# Patient Record
Sex: Female | Born: 1975 | Race: Black or African American | Hispanic: No | Marital: Married | Smoking: Never smoker
Health system: Southern US, Community
[De-identification: ages and names within clinical notes are randomized; demographics above are authoritative.]

## PROBLEM LIST (undated history)

## (undated) DIAGNOSIS — R519 Headache, unspecified: Secondary | ICD-10-CM

## (undated) DIAGNOSIS — R87619 Unspecified abnormal cytological findings in specimens from cervix uteri: Secondary | ICD-10-CM

## (undated) DIAGNOSIS — G43909 Migraine, unspecified, not intractable, without status migrainosus: Secondary | ICD-10-CM

## (undated) DIAGNOSIS — R51 Headache: Secondary | ICD-10-CM

## (undated) DIAGNOSIS — I10 Essential (primary) hypertension: Secondary | ICD-10-CM

## (undated) DIAGNOSIS — E119 Type 2 diabetes mellitus without complications: Secondary | ICD-10-CM

## (undated) HISTORY — PX: LEEP: SHX91

## (undated) HISTORY — DX: Migraine, unspecified, not intractable, without status migrainosus: G43.909

## (undated) HISTORY — DX: Headache: R51

## (undated) HISTORY — DX: Headache, unspecified: R51.9

## (undated) HISTORY — PX: TUBAL LIGATION: SHX77

## (undated) HISTORY — DX: Type 2 diabetes mellitus without complications: E11.9

## (undated) HISTORY — DX: Unspecified abnormal cytological findings in specimens from cervix uteri: R87.619

## (undated) HISTORY — DX: Essential (primary) hypertension: I10

---

## 2007-08-17 ENCOUNTER — Ambulatory Visit: Payer: Self-pay

## 2007-11-17 ENCOUNTER — Emergency Department: Payer: Self-pay | Admitting: Emergency Medicine

## 2009-04-05 IMAGING — US US EXTREM LOW VENOUS*R*
1 series · 18 of 23 positions shown · non-contrast
Comparison: none

REASON FOR EXAM: swelling, R leg, eval for dvt
COMMENTS:

PROCEDURE:     US  - US DOPPLER LOW EXTR RIGHT  - November 17, 2007  [DATE]
RESULT:     Standard RIGHT lower extremity color-flow Duplex Doppler reveals
no evidence of deep venous thrombosis.  Color-flow analysis and Doppler
analysis are negative.

[Series 1: us extrem low venous*right* · 18 of 23 slices shown]
[im 1/23]
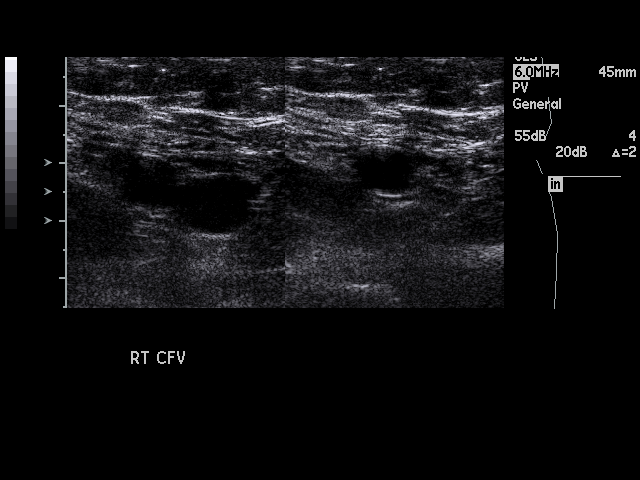
[im 2/23]
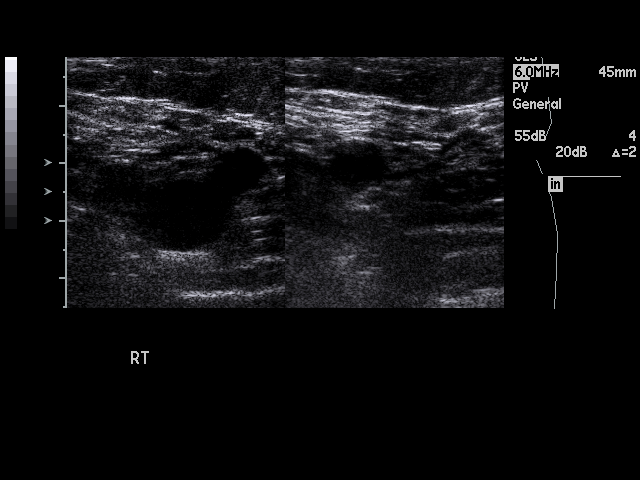
[im 4/23]
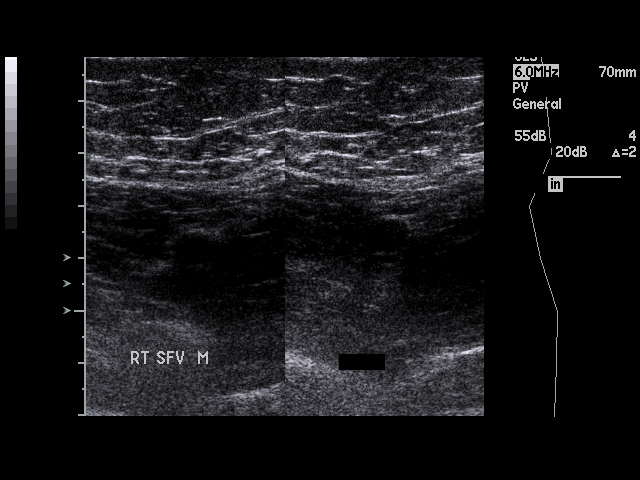
[im 5/23]
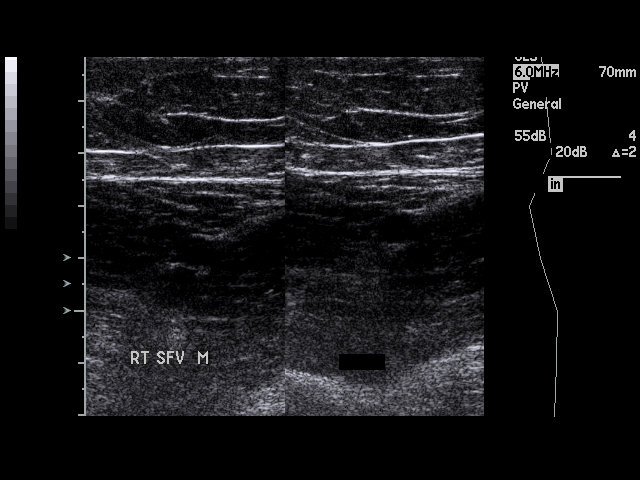
[im 6/23]
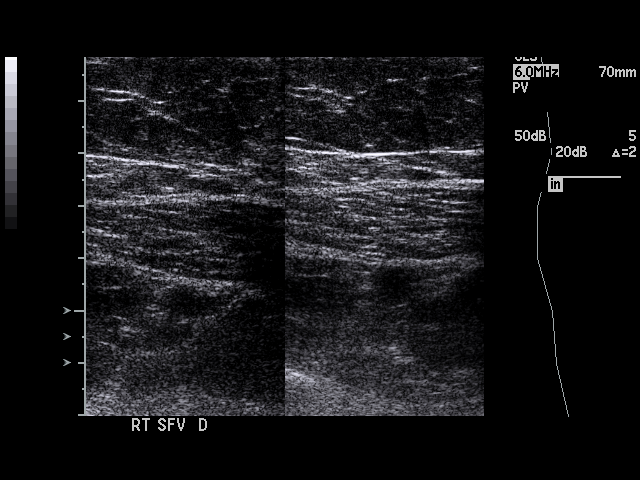
[im 8/23]
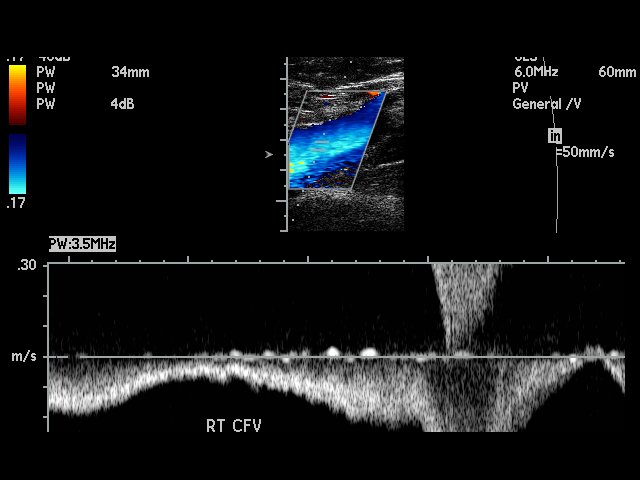
[im 9/23]
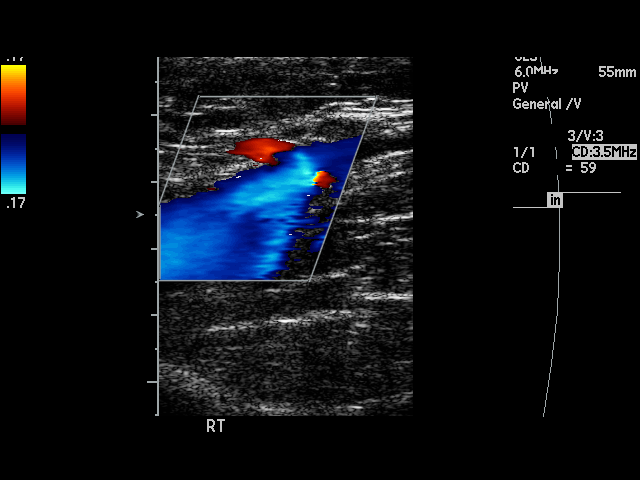
[im 10/23]
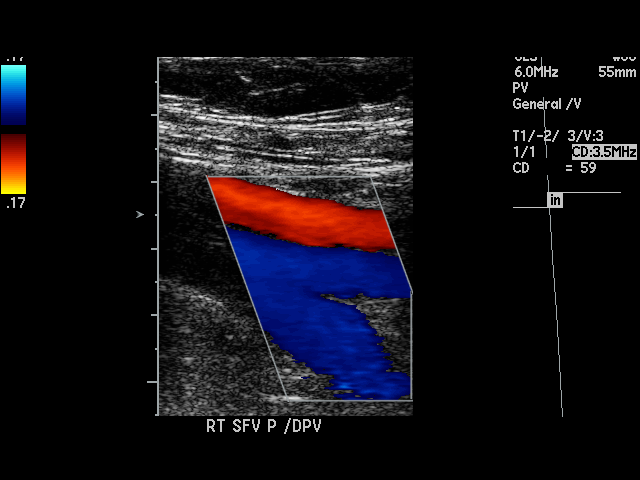
[im 11/23]
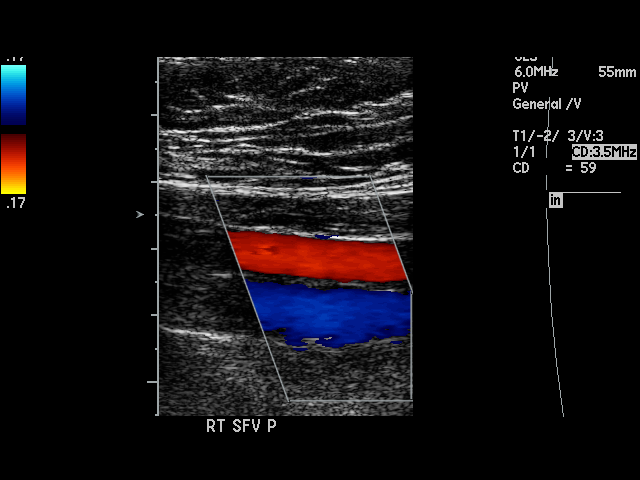
[im 13/23]
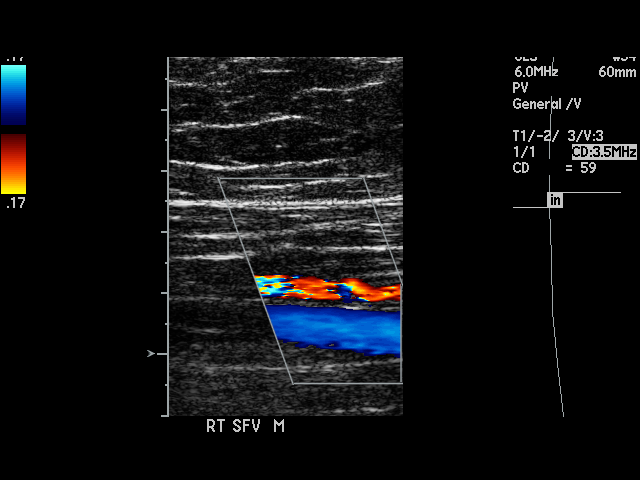
[im 14/23]
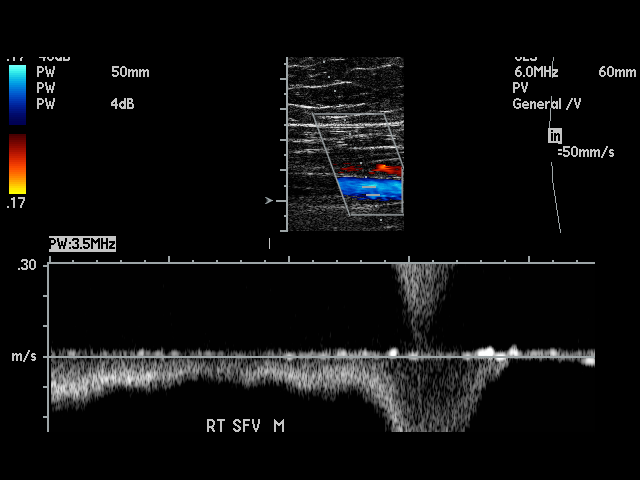
[im 15/23]
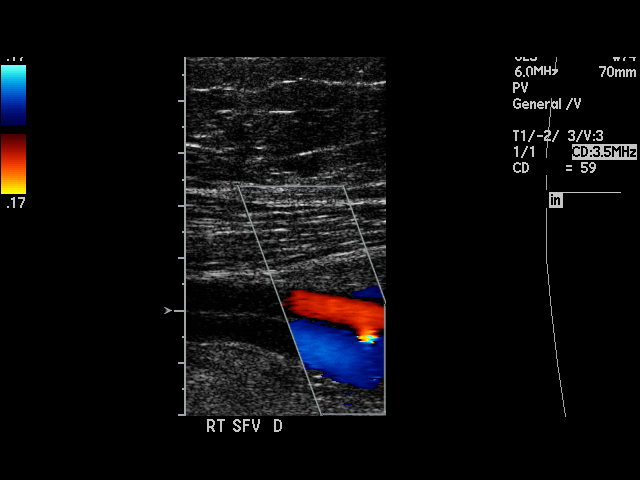
[im 16/23]
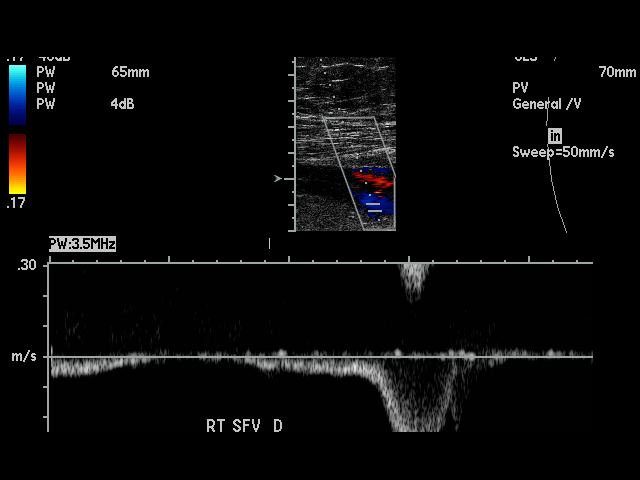
[im 18/23]
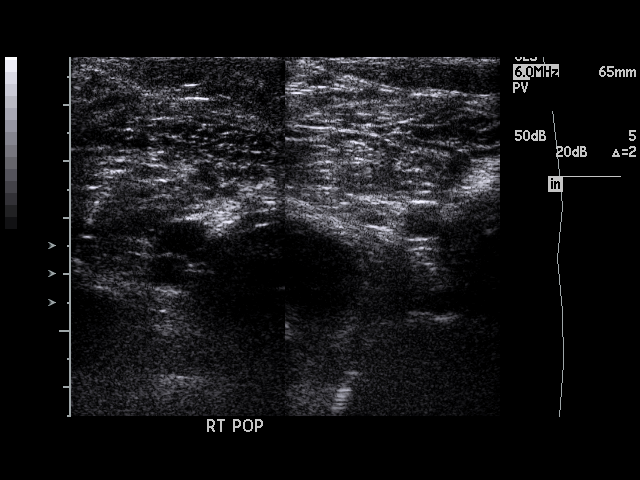
[im 19/23]
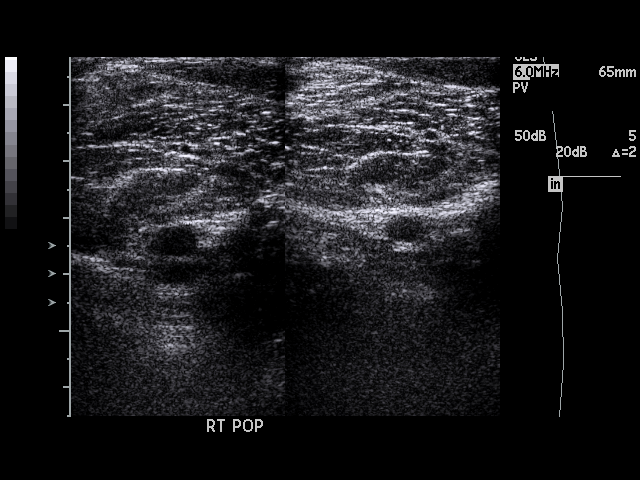
[im 20/23]
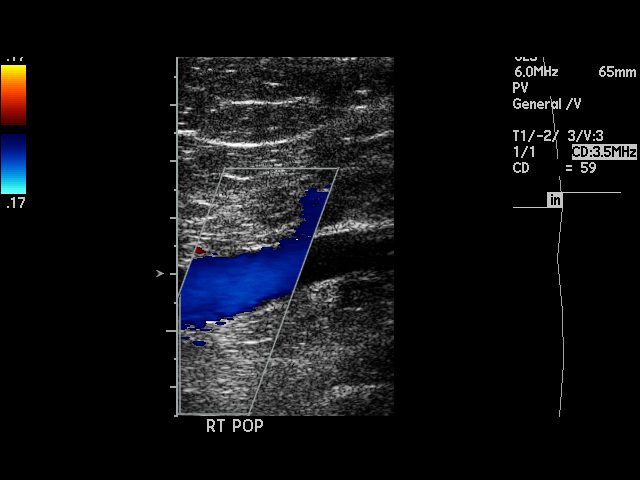
[im 22/23]
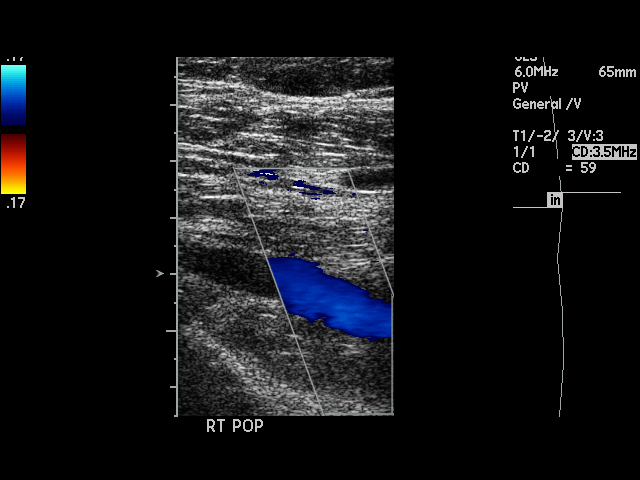
[im 23/23]
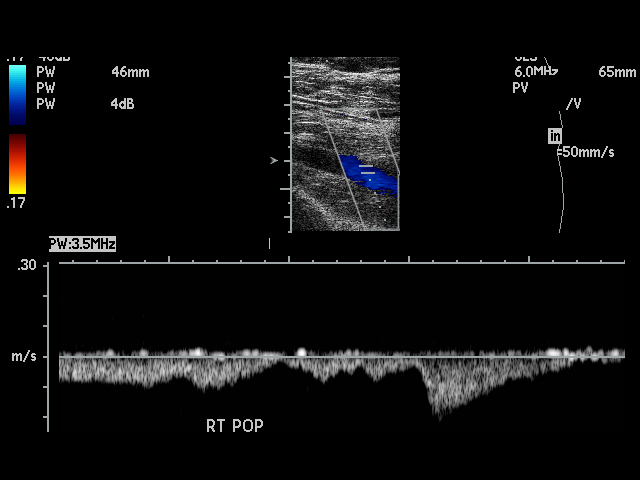

[18 of 23 positions shown; findings below may reference images not displayed]

IMPRESSION: 1)Negative exam.

## 2009-04-05 IMAGING — CR DG FEMUR 2V*R*
1 series · 4 of 4 positions shown · non-contrast
Comparison: none

REASON FOR EXAM: swelling, pain, R thigh
COMMENTS:

PROCEDURE:     DXR - DXR FEMUR RIGHT  - November 17, 2007  [DATE]
RESULT:     No fracture, dislocation or other acute bony abnormality is
identified. No periosteal reactive change is seen. No lytic or blastic
lesions are noted. No radiodense soft tissue foreign body is observed.

[Series 1: view not recorded · 0.17mm/px · 4 of 4 slices shown]
[im 1/4]
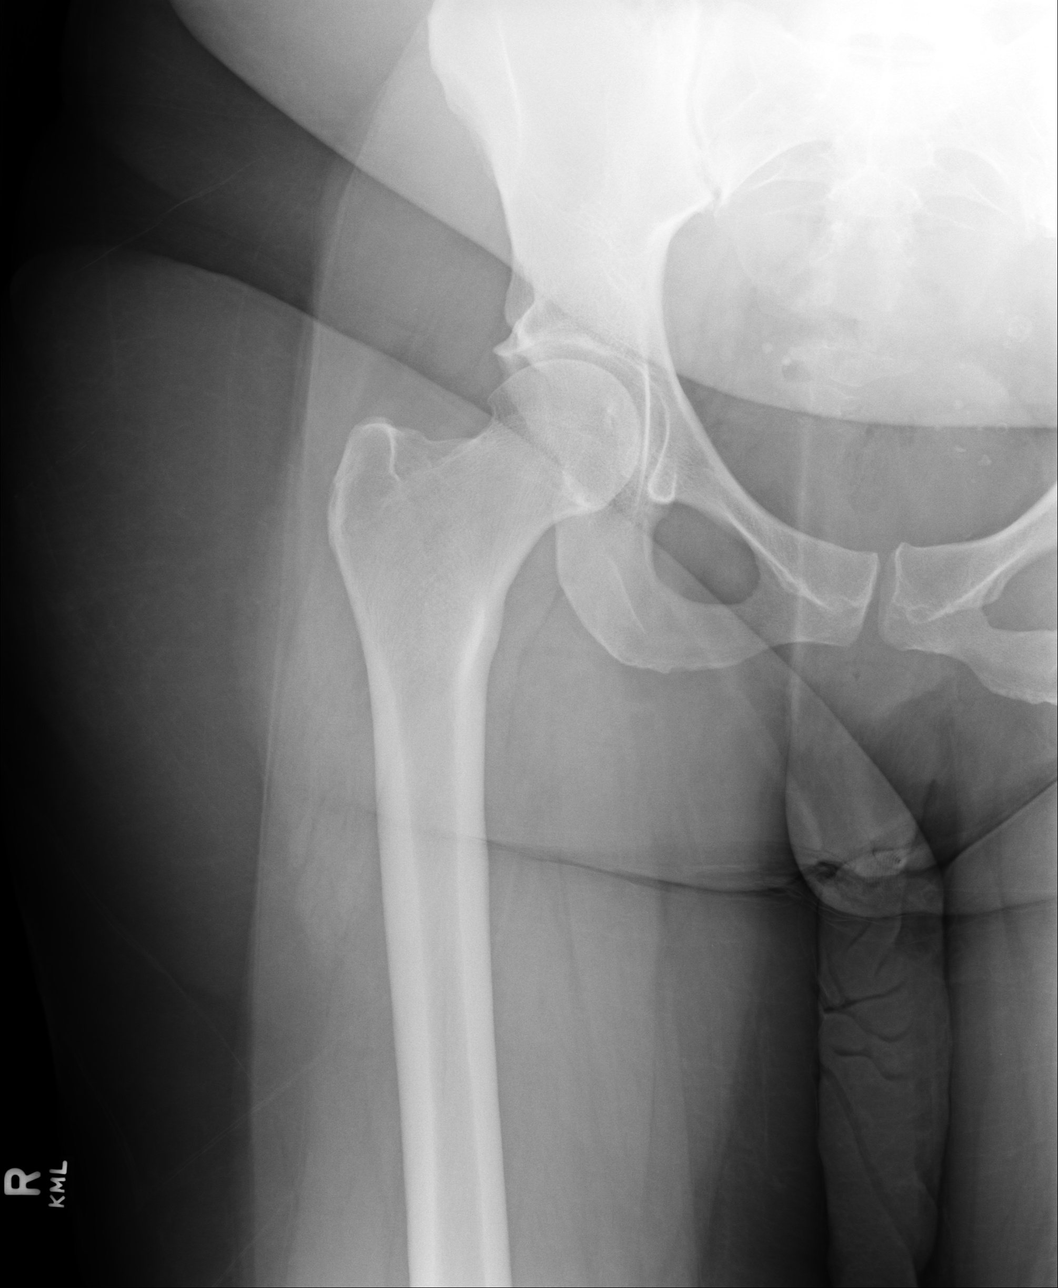
[im 2/4]
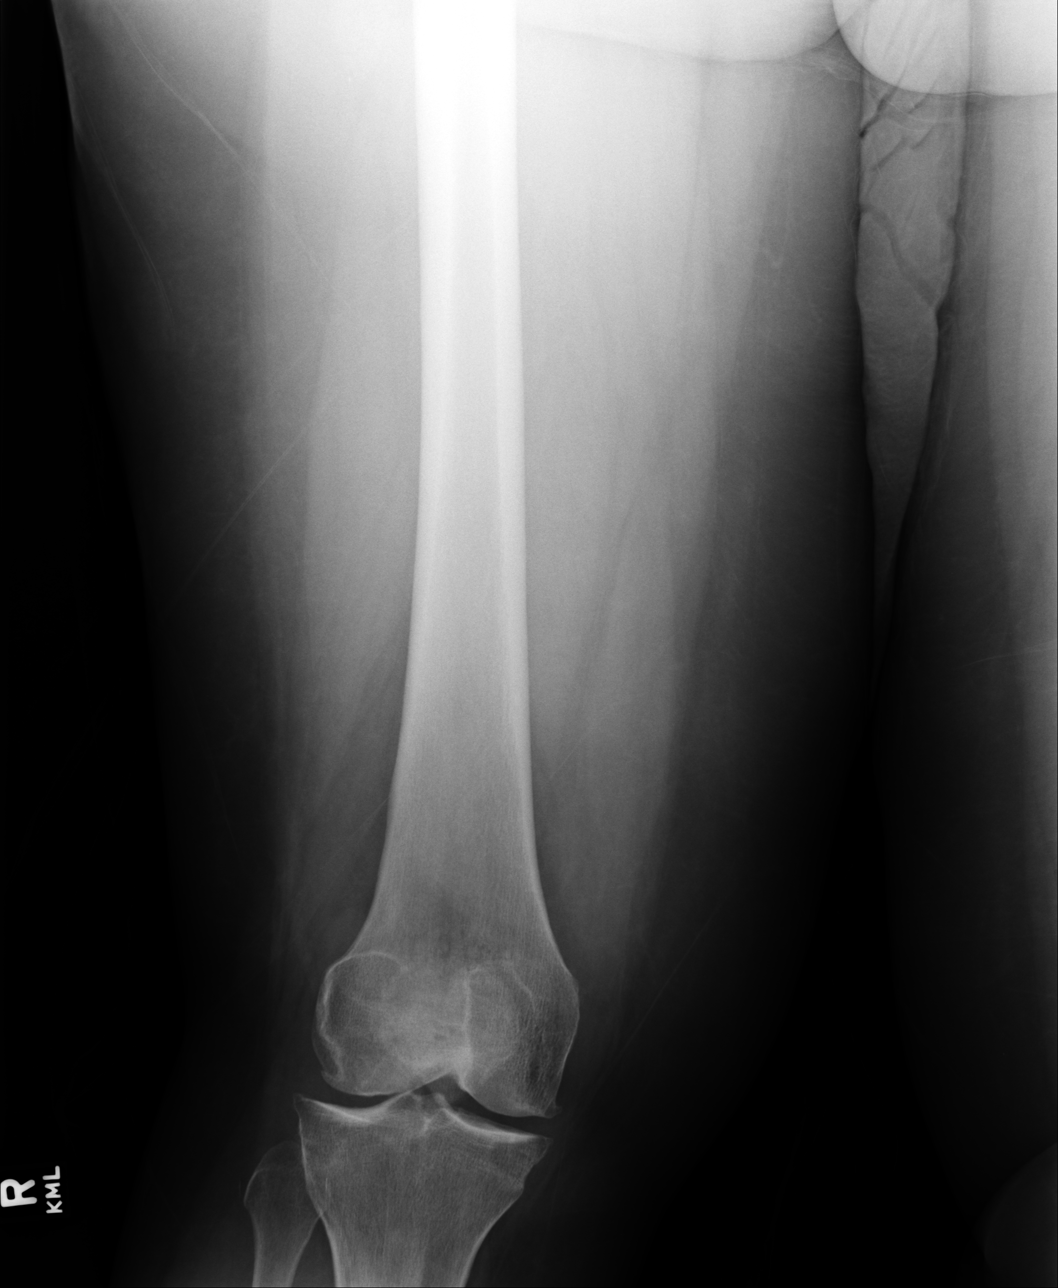
[im 3/4]
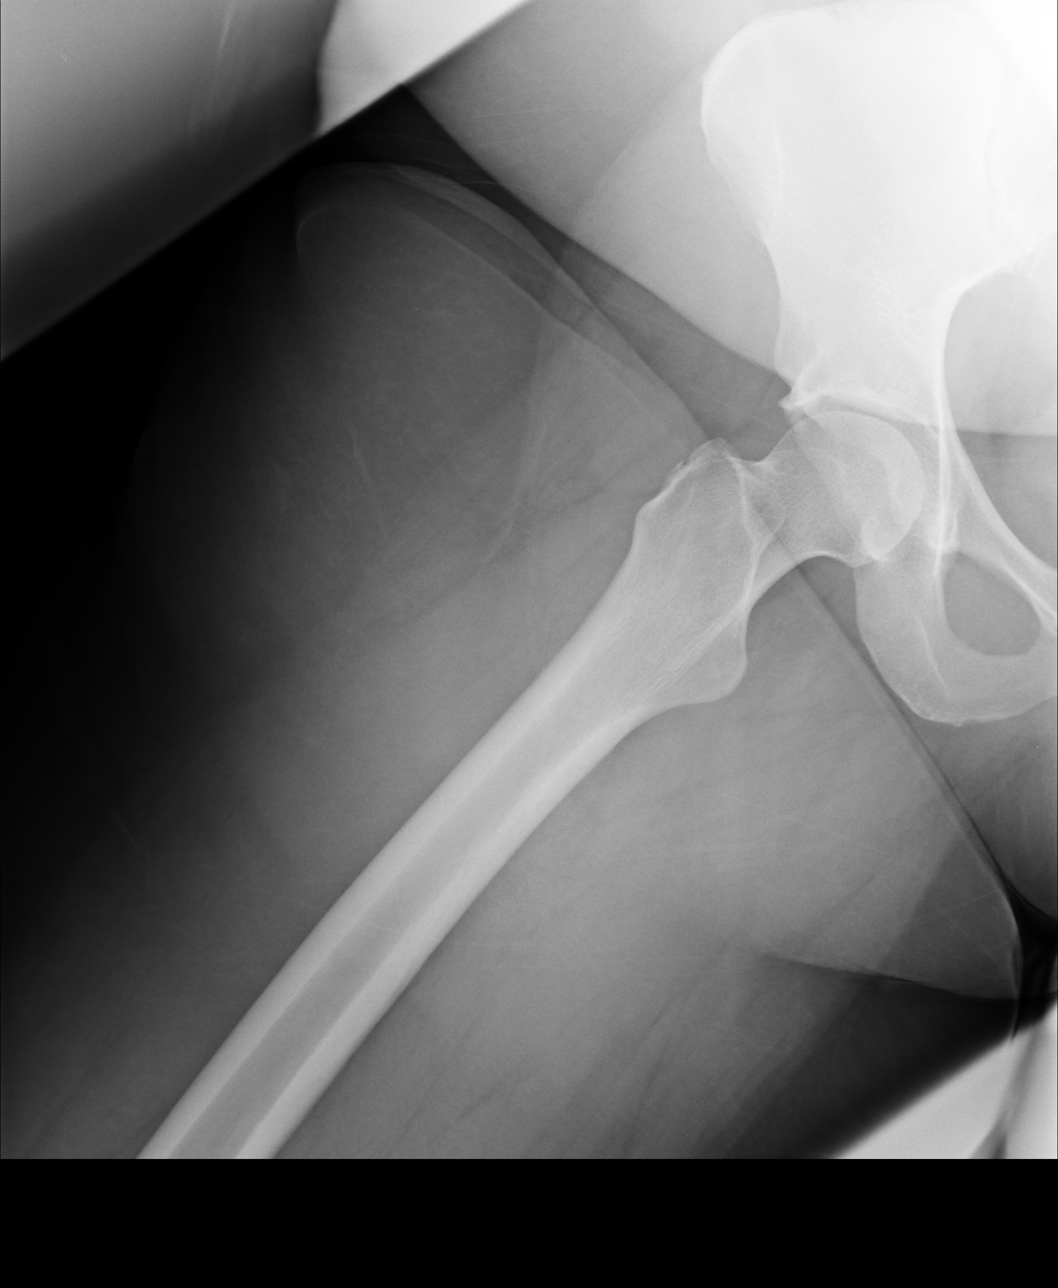
[im 4/4]
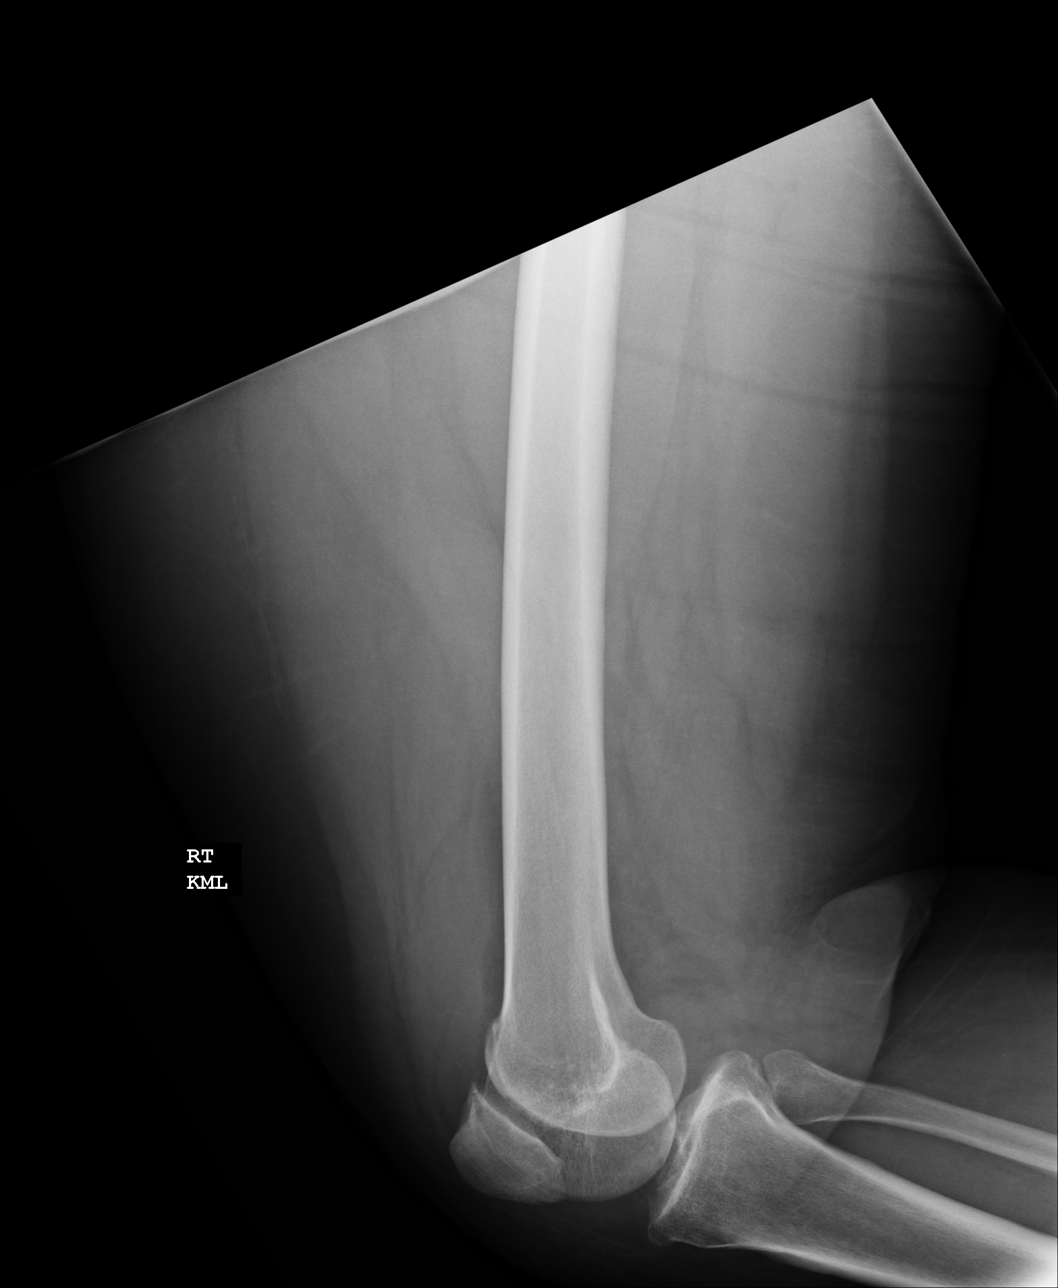

[4 of 4 positions shown; findings below may reference images not displayed]

IMPRESSION: 1.     No significant abnormalities are identified.

## 2009-09-18 ENCOUNTER — Ambulatory Visit: Payer: Self-pay

## 2011-01-21 ENCOUNTER — Ambulatory Visit: Payer: Self-pay | Admitting: Family Medicine

## 2011-02-22 ENCOUNTER — Ambulatory Visit: Payer: Self-pay | Admitting: Family Medicine

## 2013-10-04 DIAGNOSIS — R87619 Unspecified abnormal cytological findings in specimens from cervix uteri: Secondary | ICD-10-CM

## 2013-10-04 HISTORY — DX: Unspecified abnormal cytological findings in specimens from cervix uteri: R87.619

## 2015-10-25 ENCOUNTER — Ambulatory Visit (INDEPENDENT_AMBULATORY_CARE_PROVIDER_SITE_OTHER): Payer: 59 | Admitting: Family Medicine

## 2015-10-25 ENCOUNTER — Encounter: Payer: Self-pay | Admitting: Family Medicine

## 2015-10-25 VITALS — BP 132/90 | HR 85 | Temp 98.5°F | Ht 67.0 in | Wt 264.1 lb

## 2015-10-25 DIAGNOSIS — G44229 Chronic tension-type headache, not intractable: Secondary | ICD-10-CM

## 2015-10-25 DIAGNOSIS — Z8742 Personal history of other diseases of the female genital tract: Secondary | ICD-10-CM | POA: Diagnosis not present

## 2015-10-25 DIAGNOSIS — R519 Headache, unspecified: Secondary | ICD-10-CM | POA: Insufficient documentation

## 2015-10-25 DIAGNOSIS — R51 Headache: Secondary | ICD-10-CM

## 2015-10-25 DIAGNOSIS — Z23 Encounter for immunization: Secondary | ICD-10-CM | POA: Diagnosis not present

## 2015-10-25 DIAGNOSIS — I10 Essential (primary) hypertension: Secondary | ICD-10-CM | POA: Diagnosis not present

## 2015-10-25 LAB — CBC
HCT: 36 % (ref 36.0–46.0)
HEMOGLOBIN: 11.8 g/dL — AB (ref 12.0–15.0)
MCHC: 32.7 g/dL (ref 30.0–36.0)
MCV: 87.3 fl (ref 78.0–100.0)
Platelets: 251 10*3/uL (ref 150.0–400.0)
RBC: 4.12 Mil/uL (ref 3.87–5.11)
RDW: 15 % (ref 11.5–15.5)
WBC: 4.4 10*3/uL (ref 4.0–10.5)

## 2015-10-25 LAB — COMPREHENSIVE METABOLIC PANEL
ALBUMIN: 3.6 g/dL (ref 3.5–5.2)
ALT: 15 U/L (ref 0–35)
AST: 14 U/L (ref 0–37)
Alkaline Phosphatase: 56 U/L (ref 39–117)
BUN: 9 mg/dL (ref 6–23)
CALCIUM: 8.8 mg/dL (ref 8.4–10.5)
CHLORIDE: 106 meq/L (ref 96–112)
CO2: 24 mEq/L (ref 19–32)
CREATININE: 0.77 mg/dL (ref 0.40–1.20)
GFR: 106.02 mL/min (ref >60.00)
Glucose, Bld: 104 mg/dL — ABNORMAL HIGH (ref 70–99)
POTASSIUM: 3.7 meq/L (ref 3.5–5.1)
Sodium: 138 mEq/L (ref 135–145)
Total Bilirubin: 0.2 mg/dL (ref 0.2–1.2)
Total Protein: 6.7 g/dL (ref 6.0–8.3)

## 2015-10-25 LAB — HEMOGLOBIN A1C: Hgb A1c MFr Bld: 6.2 % (ref 4.6–6.5)

## 2015-10-25 LAB — LIPID PANEL
Cholesterol: 120 mg/dL (ref 0–200)
HDL: 37.8 mg/dL — AB (ref >39.00)
LDL Cholesterol: 71 mg/dL (ref 0–99)
NONHDL: 82.37
TRIGLYCERIDES: 58 mg/dL (ref 0.0–149.0)
Total CHOL/HDL Ratio: 3
VLDL: 11.6 mg/dL (ref 0.0–40.0)

## 2015-10-25 LAB — TSH: TSH: 2.02 u[IU]/mL (ref 0.35–4.50)

## 2015-10-25 MED ORDER — AMLODIPINE BESYLATE 5 MG PO TABS: 5.0000 mg | ORAL_TABLET | Freq: Every day | ORAL | Status: DC

## 2015-10-25 NOTE — Progress Notes (Signed)
Patient ID: [name], female   DOB: 1976/04/20, 40 y.o.   MRN: [id number]  Tommi Rumps, MD Phone: [phone]  Donna Valencia is a 40 y.o. female who presents today for new patient visit.  Hypertension: Patient notes she was previously on blood pressure medicine and took herself off of them last October. She notes since doing this she has developed some headaches that she has previously had when her blood pressure was uncontrolled. She checks her blood pressure at home and it has recently been in the 160s over 90s. She denies chest pain, shortness of breath, vision changes, numbness, weakness, and edema. She was previously on lisinopril.  Headaches: Patient notes the headaches are frontal in nature. Occurred 2 times per week. Last all day. Do not wake her from sleep. She denies numbness, weakness, and vision changes with these. She notes Tylenol helps with them.  Patient also notes it she had an abnormal Pap smear or most recent Pap smear 2015. She was supposed to follow up with gynecology for this though never did.  Active Ambulatory Problems    Diagnosis Date Noted  . Essential hypertension 10/25/2015  . Headache 10/25/2015  . History of abnormal cervical Pap smear 10/25/2015   Resolved Ambulatory Problems    Diagnosis Date Noted  . No Resolved Ambulatory Problems   Past Medical History  Diagnosis Date  . Hypertension   . Migraines     Family History  Problem Relation Age of Onset  . Arthritis    . Breast cancer      Grandparent, another relative  . Hypertension      Parent  . Diabetes      Parent  . Cancer      Both parents    Social History   Social History  . Marital Status: Married    Spouse Name: N/A  . Number of Children: N/A  . Years of Education: N/A   Occupational History  . Not on file.   Social History Main Topics  . Smoking status: Never Smoker   . Smokeless tobacco: Not on file  . Alcohol Use: No  . Drug Use: No  . Sexual Activity: Not on  file   Other Topics Concern  . Not on file   Social History Narrative  . No narrative on file    ROS   General:  Negative for nexplained weight loss, fever Skin: Negative for new or changing mole, sore that won't heal HEENT: Negative for trouble hearing, trouble seeing, ringing in ears, mouth sores, hoarseness, change in voice, dysphagia. CV:  Negative for chest pain, dyspnea, edema, palpitations Resp: Negative for cough, dyspnea, hemoptysis GI: Negative for nausea, vomiting, diarrhea, constipation, abdominal pain, melena, hematochezia. GU: Negative for dysuria, incontinence, urinary hesitance, hematuria, vaginal or penile discharge, polyuria, sexual difficulty, lumps in testicle or breasts MSK: Negative for muscle cramps or aches, joint pain or swelling Neuro: Positive for headaches, Negative for weakness, numbness, dizziness, passing out/fainting Psych: Negative for depression, anxiety, memory problems  Objective  Physical Exam Filed Vitals:   10/25/15 0932  BP: 132/90  Pulse: 85  Temp: 98.5 F (36.9 C)    BP Readings from Last 3 Encounters:  10/25/15 132/90   Wt Readings from Last 3 Encounters:  10/25/15 264 lb 1.9 oz (119.804 kg)    Physical Exam  Constitutional: She is well-developed, well-nourished, and in no distress.  HENT:  Head: Normocephalic and atraumatic.  Right Ear: External ear normal.  Left Ear: External ear  normal.  Mouth/Throat: Oropharynx is clear and moist. No oropharyngeal exudate.  Eyes: Conjunctivae are normal. Pupils are equal, round, and reactive to light.  Neck: Neck supple.  Cardiovascular: Normal rate, regular rhythm and normal heart sounds.  Exam reveals no gallop and no friction rub.   No murmur heard. Pulmonary/Chest: Effort normal and breath sounds normal. No respiratory distress. She has no wheezes. She has no rales.  Abdominal: Soft. Bowel sounds are normal. She exhibits no distension. There is no tenderness. There is no rebound  and no guarding.  Musculoskeletal: She exhibits no edema.  Lymphadenopathy:    She has no cervical adenopathy.  Neurological: She is alert. Gait normal.  CN 2-12 intact, 5/5 strength in bilateral biceps, triceps, grip, quads, hamstrings, plantar and dorsiflexion, sensation to light touch intact in bilateral UE and LE, normal gait, 2+ patellar reflexes  Skin: Skin is warm and dry. She is not diaphoretic.  Psychiatric: Mood and affect normal.     Assessment/Plan:   Essential hypertension Diastolic blood pressure above goal today. Patient's blood pressures that are reported at home are significantly above goal systolically. She does report headaches with this. Otherwise asymptomatic. We will start her on amlodipine 5 mg daily. We will check lab work as listed below.  Headache Patient's headaches are tension in nature. She is neurologically intact. No neurological symptoms. Could be related to her blood pressure as well. We will treat her blood pressure and she will continue Tylenol as needed. She is given return precautions.  History of abnormal cervical Pap smear Patient reports history of abnormal Pap smear in 2015. She never followed up with her gynecologist. She was advised that she needed to follow-up with the gynecologist for this issue.    Orders Placed This Encounter  Procedures  . Tdap vaccine greater than or equal to 7yo IM  . Comp Met (CMET)  . TSH  . CBC  . Lipid Profile  . HgB A1c    Meds ordered this encounter  Medications  . amLODipine (NORVASC) 5 MG tablet    Sig: Take 1 tablet (5 mg total) by mouth daily.    Dispense:  90 tablet    Refill:  [place], [place]

## 2015-10-25 NOTE — Assessment & Plan Note (Signed)
Patient's headaches are tension in nature. She is neurologically intact. No neurological symptoms. Could be related to her blood pressure as well. We will treat her blood pressure and she will continue Tylenol as needed. She is given return precautions.

## 2015-10-25 NOTE — Patient Instructions (Signed)
Nice to meet you. We will start you on amlodipine for your blood pressure. You needs to monitor your blood pressure at home. You should do this once a day while sitting there relaxing. Your goal blood pressure is less than 140/90. Please monitor your headaches. If you develop numbness, weakness, or vision changes please seek medical attention. You need to follow-up with the gynecologist given your history of abnormal Pap smear.

## 2015-10-25 NOTE — Progress Notes (Signed)
Pre visit review using our clinic review tool, if applicable. No additional management support is needed unless otherwise documented below in the visit note. 

## 2015-10-25 NOTE — Assessment & Plan Note (Signed)
Patient reports history of abnormal Pap smear in 2015. She never followed up with her gynecologist. She was advised that she needed to follow-up with the gynecologist for this issue.

## 2015-10-25 NOTE — Assessment & Plan Note (Signed)
Diastolic blood pressure above goal today. Patient's blood pressures that are reported at home are significantly above goal systolically. She does report headaches with this. Otherwise asymptomatic. We will start her on amlodipine 5 mg daily. We will check lab work as listed below.

## 2015-10-27 ENCOUNTER — Telehealth: Payer: Self-pay | Admitting: *Deleted

## 2015-10-27 NOTE — Telephone Encounter (Signed)
Patient requested a call for her lab results from 10/25/15 Pt Contact [phone]

## 2015-10-27 NOTE — Telephone Encounter (Signed)
Notified pt of test results

## 2015-11-27 ENCOUNTER — Ambulatory Visit (INDEPENDENT_AMBULATORY_CARE_PROVIDER_SITE_OTHER): Payer: 59 | Admitting: Family Medicine

## 2015-11-27 ENCOUNTER — Encounter: Payer: Self-pay | Admitting: Family Medicine

## 2015-11-27 VITALS — BP 124/86 | HR 90 | Temp 98.5°F | Ht 67.0 in | Wt 264.8 lb

## 2015-11-27 DIAGNOSIS — G44229 Chronic tension-type headache, not intractable: Secondary | ICD-10-CM | POA: Diagnosis not present

## 2015-11-27 DIAGNOSIS — I10 Essential (primary) hypertension: Secondary | ICD-10-CM | POA: Diagnosis not present

## 2015-11-27 DIAGNOSIS — D649 Anemia, unspecified: Secondary | ICD-10-CM

## 2015-11-27 DIAGNOSIS — R7303 Prediabetes: Secondary | ICD-10-CM | POA: Diagnosis not present

## 2015-11-27 DIAGNOSIS — Z8742 Personal history of other diseases of the female genital tract: Secondary | ICD-10-CM

## 2015-11-27 LAB — CBC
HEMATOCRIT: 35.1 % — AB (ref 36.0–46.0)
HEMOGLOBIN: 11.7 g/dL — AB (ref 12.0–15.0)
MCHC: 33.2 g/dL (ref 30.0–36.0)
MCV: 86.7 fl (ref 78.0–100.0)
PLATELETS: 266 10*3/uL (ref 150.0–400.0)
RBC: 4.05 Mil/uL (ref 3.87–5.11)
RDW: 15 % (ref 11.5–15.5)
WBC: 4.3 10*3/uL (ref 4.0–10.5)

## 2015-11-27 NOTE — Assessment & Plan Note (Signed)
No recurrence. Suspect was related to her blood pressure. Continue to monitor.

## 2015-11-27 NOTE — Assessment & Plan Note (Signed)
Blood pressure at goal. Continue amlodipine.

## 2015-11-27 NOTE — Assessment & Plan Note (Signed)
Minimally anemic. Asymptomatic. No reported bleeding from anything other than her periods. No family history of colon cancer. We will recheck a CBC.

## 2015-11-27 NOTE — Patient Instructions (Addendum)
Nice to see you. Your blood pressure is much improved. Please continue to monitor this at home. We will check your hemoglobin again given that you were mildly anemic at her last visit. Please work on diet and exercise. This can be by walking 1-2 times a week for 20-30 minutes to start with and increasing as tolerated. Please follow the diet instructions below. If you develop recurrent headaches, or develop numbness, weakness, vision changes, chest pain, shortness of breath, lightheadedness, palpitations, or any new or changing symptoms please seek medical attention.  Diet Recommendations  Starchy (carb) foods: Bread, rice, pasta, potatoes, corn, cereal, grits, crackers, bagels, muffins, all baked goods.  (Fruits, milk, and yogurt also have carbohydrate, but most of these foods will not spike your blood sugar as the starchy foods will.)  A few fruits do cause high blood sugars; use small portions of bananas (limit to 1/2 at a time), grapes, watermelon, oranges, and most tropical fruits.    Protein foods: Meat, fish, poultry, eggs, dairy foods, and beans such as pinto and kidney beans (beans also provide carbohydrate).   1. Eat at least 3 meals and 1-2 snacks per day. Never go more than 4-5 hours while awake without eating. Eat breakfast within the first hour of getting up.   2. Limit starchy foods to TWO per meal and ONE per snack. ONE portion of a starchy  food is equal to the following:   - ONE slice of bread (or its equivalent, such as half of a hamburger bun).   - 1/2 cup of a "scoopable" starchy food such as potatoes or rice.   - 15 grams of carbohydrate as shown on food label.  3. Include at every meal: a protein food, a carb food, and vegetables and/or fruit.   - Obtain twice the volume of veg's as protein or carbohydrate foods for both lunch and dinner.   - Fresh or frozen veg's are best.   - Keep frozen veg's on hand for a quick vegetable serving.

## 2015-11-27 NOTE — Progress Notes (Signed)
Patient ID: Donna BoeckSandy R Hillesheim, female   DOB: 03/28/1976, 40 y.o.   MRN: [id number]  Marikay AlarEric Sonnenberg, MD Phone: [phone]  Donna Valencia is a [age] y.o. female who presents today for follow-up.  HYPERTENSION Disease Monitoring Home BP Monitoring 130s over 80s Chest pain- no    Dyspnea- no Medications Compliance-  taking amlodipine. Lightheadedness-  no  Edema- minimal, notes a minimal amount of ankle puffiness when she stands on her feet for a long time. Goes down at night. Has been going on for a number of years. No trouble breathing. Has not worsened with amlodipine. No recurrence of headaches since being placed on blood pressure medicine.   Prediabetes: Patient's A1c was 6.2. Notes she has altered her diet somewhat. Decreased fried foods. Decrease sodium intake. Eating more salads and vegetables. She does eat out a fair amount. She does not exercise.  Anemia: Patient mildly anemic with a hemoglobin of 11.8. Denies rectal bleeding. Notes periods last for about 6 days once a month. Typically heavy for the first 2-3 days. Uses 7-8 pads a day. No history of colon cancer in her family. No blood in her stool. No bleeding from elsewhere. No lightheadedness or palpitations.   She has not set up a follow-up appointment with her gynecologist for her previously abnormal Pap smear.   PMH: nonsmoker.   ROSSee history of present illness  Objective  Physical Exam Filed Vitals:   11/27/15 1003  BP: 124/86  Pulse: 90  Temp: 98.5 F (36.9 C)    BP Readings from Last 3 Encounters:  11/27/15 124/86  10/25/15 132/90   Wt Readings from Last 3 Encounters:  11/27/15 264 lb 12.8 oz (120.112 kg)  10/25/15 264 lb 1.9 oz (119.804 kg)    Physical Exam  Constitutional: She is well-developed, well-nourished, and in no distress.  HENT:  Head: Normocephalic and atraumatic.  Right Ear: External ear normal.  Left Ear: External ear normal.  Cardiovascular: Normal rate, regular rhythm and normal heart  sounds.   Pulmonary/Chest: Effort normal and breath sounds normal.  Musculoskeletal: She exhibits no edema.  Neurological: She is alert. Gait normal.  Skin: Skin is warm and dry. She is not diaphoretic.     Assessment/Plan: Please see individual problem list.  Essential hypertension Blood pressure at goal. Continue amlodipine.  Headache No recurrence. Suspect was related to her blood pressure. Continue to monitor.  Anemia Minimally anemic. Asymptomatic. No reported bleeding from anything other than her periods. No family history of colon cancer. We will recheck a CBC.  Prediabetes A1c of 6.2 last visit. Discussed diet and exercise with patient. Encouraged exercising 1-2 times a week to start with. Given diet information. We'll continue to monitor.  History of abnormal cervical Pap smear Advised patient that she needs to follow-up with her gynecologist. She states she will call them to set up an appointment.    Orders Placed This Encounter  Procedures  . CBC    Marikay AlarEric Sonnenberg, MD [facility] [place]

## 2015-11-27 NOTE — Assessment & Plan Note (Signed)
A1c of 6.2 last visit. Discussed diet and exercise with patient. Encouraged exercising 1-2 times a week to start with. Given diet information. We'll continue to monitor.

## 2015-11-27 NOTE — Assessment & Plan Note (Signed)
Advised patient that she needs to follow-up with her gynecologist. She states she will call them to set up an appointment.

## 2015-11-27 NOTE — Progress Notes (Signed)
Pre visit review using our clinic review tool, if applicable. No additional management support is needed unless otherwise documented below in the visit note. 

## 2016-02-26 ENCOUNTER — Ambulatory Visit: Payer: 59 | Admitting: Family Medicine

## 2016-04-01 ENCOUNTER — Ambulatory Visit: Payer: 59 | Admitting: Family Medicine

## 2016-04-05 ENCOUNTER — Ambulatory Visit (INDEPENDENT_AMBULATORY_CARE_PROVIDER_SITE_OTHER): Payer: BLUE CROSS/BLUE SHIELD | Admitting: Family Medicine

## 2016-04-05 ENCOUNTER — Encounter: Payer: Self-pay | Admitting: Family Medicine

## 2016-04-05 ENCOUNTER — Telehealth: Payer: Self-pay | Admitting: *Deleted

## 2016-04-05 ENCOUNTER — Ambulatory Visit (INDEPENDENT_AMBULATORY_CARE_PROVIDER_SITE_OTHER): Payer: BLUE CROSS/BLUE SHIELD

## 2016-04-05 VITALS — BP 128/90 | HR 88 | Temp 99.0°F | Resp 14 | Wt 269.0 lb

## 2016-04-05 DIAGNOSIS — M25562 Pain in left knee: Secondary | ICD-10-CM

## 2016-04-05 DIAGNOSIS — I10 Essential (primary) hypertension: Secondary | ICD-10-CM | POA: Diagnosis not present

## 2016-04-05 DIAGNOSIS — M1712 Unilateral primary osteoarthritis, left knee: Secondary | ICD-10-CM | POA: Insufficient documentation

## 2016-04-05 DIAGNOSIS — R600 Localized edema: Secondary | ICD-10-CM | POA: Insufficient documentation

## 2016-04-05 MED ORDER — HYDROCHLOROTHIAZIDE 25 MG PO TABS: 12.5000 mg | ORAL_TABLET | Freq: Every day | ORAL | 1 refills | Status: DC

## 2016-04-05 NOTE — Telephone Encounter (Signed)
Please advise a appt time and date in 1 month to place pt on Dr. Birdie SonsSonnenberg schedule

## 2016-04-05 NOTE — Assessment & Plan Note (Signed)
Suspect this is related to her amlodipine. She has no CHF symptoms. Most recent lab work relatively unremarkable for cause of edema. We'll discontinue her amlodipine and she'll monitor.

## 2016-04-05 NOTE — Telephone Encounter (Signed)
Patient is ok being seen with out husbands appointment being after wifes.  There are no 30 minutes slots available in a month out. Please advise

## 2016-04-05 NOTE — Telephone Encounter (Signed)
Patient does not have to be seen exactly a month from now. She can be placed anytime between 1-2 months and you can combine back-to-back 15 minute appointment. Thanks.

## 2016-04-05 NOTE — Assessment & Plan Note (Signed)
At goal on recheck though does not appear to be quite at goal at home per her report. Suspect swelling is related to the amlodipine and thus we will discontinue this. We will start her on HCTZ. She will monitor blood pressure at home and if greater than 140/90 consistently over the next 1-2 weeks she will call us and we will increase her medicine. She'll follow-up in one month.

## 2016-04-05 NOTE — Progress Notes (Signed)
Pre visit review using our clinic review tool, if applicable. No additional management support is needed unless otherwise documented below in the visit note. 

## 2016-04-05 NOTE — Telephone Encounter (Signed)
Please advise 

## 2016-04-05 NOTE — Patient Instructions (Addendum)
Nice to see you. Swelling in your ankles is likely related to the amlodipine. We will discontinue the amlodipine. We will start you on hydrochlorothiazide. We'll have a follow-up in a month. If your blood pressures consistently greater than 140/90 at home please let us know. You can use ibuprofen 600 mg every 8 hours as needed for your knee pain. You continued following exercises for your knee pain as well.   Generic Knee Exercises EXERCISES RANGE OF MOTION (ROM) AND STRETCHING EXERCISES These exercises may help you when beginning to rehabilitate your injury. Your symptoms may resolve with or without further involvement from your physician, physical therapist, or athletic trainer. While completing these exercises, remember:   Restoring tissue flexibility helps normal motion to return to the joints. This allows healthier, less painful movement and activity.  An effective stretch should be held for at least 30 seconds.  A stretch should never be painful. You should only feel a gentle lengthening or release in the stretched tissue. STRETCH - Knee Extension, Prone  Lie on your stomach on a firm surface, such as a bed or countertop. Place your right / left knee and leg just beyond the edge of the surface. You may wish to place a towel under the far end of your right / left thigh for comfort.  Relax your leg muscles and allow gravity to straighten your knee. Your clinician may advise you to add an ankle weight if more resistance is helpful for you.  You should feel a stretch in the back of your right / left knee. Hold this position for __________ seconds. Repeat __________ times. Complete this stretch __________ times per day. * Your physician, physical therapist, or athletic trainer may ask you to add ankle weight to enhance your stretch.  RANGE OF MOTION - Knee Flexion, Active  Lie on your back with both knees straight. (If this causes back discomfort, bend your opposite knee, placing your foot  flat on the floor.)  Slowly slide your heel back toward your buttocks until you feel a gentle stretch in the front of your knee or thigh.  Hold for __________ seconds. Slowly slide your heel back to the starting position. Repeat __________ times. Complete this exercise __________ times per day.  STRETCH - Quadriceps, Prone   Lie on your stomach on a firm surface, such as a bed or padded floor.  Bend your right / left knee and grasp your ankle. If you are unable to reach your ankle or pant leg, use a belt around your foot to lengthen your reach.  Gently pull your heel toward your buttocks. Your knee should not slide out to the side. You should feel a stretch in the front of your thigh and/or knee.  Hold this position for __________ seconds. Repeat __________ times. Complete this stretch __________ times per day.  STRETCH - Hamstrings, Supine   Lie on your back. Loop a belt or towel over the ball of your right / left foot.  Straighten your right / left knee and slowly pull on the belt to raise your leg. Do not allow the right / left knee to bend. Keep your opposite leg flat on the floor.  Raise the leg until you feel a gentle stretch behind your right / left knee or thigh. Hold this position for __________ seconds. Repeat __________ times. Complete this stretch __________ times per day.  STRENGTHENING EXERCISES These exercises may help you when beginning to rehabilitate your injury. They may resolve your symptoms with or  without further involvement from your physician, physical therapist, or athletic trainer. While completing these exercises, remember:   Muscles can gain both the endurance and the strength needed for everyday activities through controlled exercises.  Complete these exercises as instructed by your physician, physical therapist, or athletic trainer. Progress the resistance and repetitions only as guided.  You may experience muscle soreness or fatigue, but the pain or  discomfort you are trying to eliminate should never worsen during these exercises. If this pain does worsen, stop and make certain you are following the directions exactly. If the pain is still present after adjustments, discontinue the exercise until you can discuss the trouble with your clinician. STRENGTH - Quadriceps, Isometrics  Lie on your back with your right / left leg extended and your opposite knee bent.  Gradually tense the muscles in the front of your right / left thigh. You should see either your knee cap slide up toward your hip or increased dimpling just above the knee. This motion will push the back of the knee down toward the floor/mat/bed on which you are lying.  Hold the muscle as tight as you can without increasing your pain for __________ seconds.  Relax the muscles slowly and completely in between each repetition. Repeat __________ times. Complete this exercise __________ times per day.  STRENGTH - Quadriceps, Short Arcs   Lie on your back. Place a __________ inch towel roll under your knee so that the knee slightly bends.  Raise only your lower leg by tightening the muscles in the front of your thigh. Do not allow your thigh to rise.  Hold this position for __________ seconds. Repeat __________ times. Complete this exercise __________ times per day.  OPTIONAL ANKLE WEIGHTS: Begin with ____________________, but DO NOT exceed ____________________. Increase in 1 pound/0.5 kilogram increments.  STRENGTH - Quadriceps, Straight Leg Raises  Quality counts! Watch for signs that the quadriceps muscle is working to insure you are strengthening the correct muscles and not "cheating" by substituting with healthier muscles.  Lay on your back with your right / left leg extended and your opposite knee bent.  Tense the muscles in the front of your right / left thigh. You should see either your knee cap slide up or increased dimpling just above the knee. Your thigh may even  quiver.  Tighten these muscles even more and raise your leg 4 to 6 inches off the floor. Hold for __________ seconds.  Keeping these muscles tense, lower your leg.  Relax the muscles slowly and completely in between each repetition. Repeat __________ times. Complete this exercise __________ times per day.  STRENGTH - Hamstring, Curls  Lay on your stomach with your legs extended. (If you lay on a bed, your feet may hang over the edge.)  Tighten the muscles in the back of your thigh to bend your right / left knee up to 90 degrees. Keep your hips flat on the bed/floor.  Hold this position for __________ seconds.  Slowly lower your leg back to the starting position. Repeat __________ times. Complete this exercise __________ times per day.  OPTIONAL ANKLE WEIGHTS: Begin with ____________________, but DO NOT exceed ____________________. Increase in 1 pound/0.5 kilogram increments.  STRENGTH - Quadriceps, Squats  Stand in a door frame so that your feet and knees are in line with the frame.  Use your hands for balance, not support, on the frame.  Slowly lower your weight, bending at the hips and knees. Keep your lower legs upright so that they  are parallel with the door frame. Squat only within the range that does not increase your knee pain. Never let your hips drop below your knees.  Slowly return upright, pushing with your legs, not pulling with your hands. Repeat __________ times. Complete this exercise __________ times per day.  STRENGTH - Quadriceps, Wall Slides  Follow guidelines for form closely. Increased knee pain often results from poorly placed feet or knees.  Lean against a smooth wall or door and walk your feet out 18-24 inches. Place your feet hip-width apart.  Slowly slide down the wall or door until your knees bend __________ degrees.* Keep your knees over your heels, not your toes, and in line with your hips, not falling to either side.  Hold for __________ seconds.  Stand up to rest for __________ seconds in between each repetition. Repeat __________ times. Complete this exercise __________ times per day. * Your physician, physical therapist, or athletic trainer will alter this angle based on your symptoms and progress.   This information is not intended to replace advice given to you by your health care provider. Make sure you discuss any questions you have with your health care provider.   Document Released: 06/19/2005 Document Revised: 08/26/2014 Document Reviewed: 11/17/2008 Elsevier Interactive Patient Education [employer] Inc.

## 2016-04-05 NOTE — Telephone Encounter (Signed)
Tried to call patient and leave message but was unable to do so, due to voicemail box not being set up.

## 2016-04-05 NOTE — Assessment & Plan Note (Signed)
Suspect osteoarthritis as cause. Discussed anti-inflammatory use for this. She is given exercises to complete for strengthening the muscles around her knees. She is advised to lose weight as this would benefit her knees. We will obtain x-rays to confirm arthritic changes.

## 2016-04-05 NOTE — Progress Notes (Signed)
Donna AlarEric Alontae Chaloux, MD Phone: [phone]  Donna BoeckSandy R Valencia is a 40 y.o. female who presents today for follow-up.  HYPERTENSION  Disease Monitoring  Home BP Monitoring 140/80 Chest pain- no    Dyspnea- no Medications  Compliance-  taking amlodipine.  Edema- yes, see below  Patient notes persistent edema in her bilateral ankles for the last month per her report. On review of her last office visit she complained of puffiness in her ankles. Left slightly greater than right. Mild discomfort with it but mostly it is just there. Does not improve with propping her legs up significantly. No orthopnea or PND.  Left knee pain: Patient notes intermittent leg swells. The front of her knee hurts and is described as throbbing. Notes it is mostly constant. Hurts if she hasn't used in a while. Does not pop, LOC, or give out on her. Does not bother her going up stairs. His stream x-rays.   PMH: nonsmoker.   ROS see history of present illness  Objective  Physical Exam Vitals:   04/05/16 1419 04/05/16 1443  BP: (!) 154/92 128/90  Pulse: 88   Resp: 14   Temp: 99 F (37.2 C)     BP Readings from Last 3 Encounters:  04/05/16 128/90  11/27/15 124/86  10/25/15 132/90   Wt Readings from Last 3 Encounters:  04/05/16 269 lb (122 kg)  11/27/15 264 lb 12.8 oz (120.1 kg)  10/25/15 264 lb 1.9 oz (119.8 kg)    Physical Exam  Constitutional: No distress.  Cardiovascular: Normal rate, regular rhythm and normal heart sounds.   1+ pitting edema bilateral ankles to mid shin, no tenderness bilateral calves  Pulmonary/Chest: Effort normal and breath sounds normal.  Musculoskeletal:  Left knee with mild discomfort on palpation of entire anterior aspect with soft tissues, no bony tenderness, no swelling noted, no warmth or erythema, no ligament laxity, negative McMurray's Right knee with no tenderness, warmth, or erythema, no ligament laxity, negative McMurray's  Neurological: She is alert. Gait normal.    Skin: Skin is warm and dry. She is not diaphoretic.     Assessment/Plan: Please see individual problem list.  Essential hypertension At goal on recheck though does not appear to be quite at goal at home per her report. Suspect swelling is related to the amlodipine and thus we will discontinue this. We will start her on HCTZ. She will monitor blood pressure at home and if greater than 140/90 consistently over the next 1-2 weeks she will call us and we will increase her medicine. She'll follow-up in one month.  Bilateral lower extremity edema Suspect this is related to her amlodipine. She has no CHF symptoms. Most recent lab work relatively unremarkable for cause of edema. We'll discontinue her amlodipine and she'll monitor.  Left knee pain Suspect osteoarthritis as cause. Discussed anti-inflammatory use for this. She is given exercises to complete for strengthening the muscles around her knees. She is advised to lose weight as this would benefit her knees. We will obtain x-rays to confirm arthritic changes.   Orders Placed This Encounter  Procedures  . DG Knee AP/LAT W/Sunrise Left    Please do standing AP and lateral.    Standing Status:   Future    Number of Occurrences:   1    Standing Expiration Date:   06/05/2017    Order Specific Question:   Reason for Exam (SYMPTOM  OR DIAGNOSIS REQUIRED)    Answer:   left knee pain for one month, no injury  Order Specific Question:   Is the patient pregnant?    Answer:   No    Order Specific Question:   Preferred imaging location?    Answer:   [facility] [place] [facility]    Meds ordered this encounter  Medications  . hydrochlorothiazide (HYDRODIURIL) 25 MG tablet    Sig: Take 0.5 tablets (12.5 mg total) by mouth daily.    Dispense:  90 tablet    Refill:  1    Donna AlarEric Donna Motl, MD [facility] [place]

## 2016-04-08 NOTE — Telephone Encounter (Signed)
Please advise 

## 2016-06-07 ENCOUNTER — Encounter: Payer: Self-pay | Admitting: Family Medicine

## 2016-06-07 ENCOUNTER — Ambulatory Visit (INDEPENDENT_AMBULATORY_CARE_PROVIDER_SITE_OTHER): Payer: BLUE CROSS/BLUE SHIELD | Admitting: Family Medicine

## 2016-06-07 VITALS — BP 128/88 | HR 98 | Temp 98.5°F | Wt 275.5 lb

## 2016-06-07 DIAGNOSIS — I10 Essential (primary) hypertension: Secondary | ICD-10-CM

## 2016-06-07 DIAGNOSIS — Z23 Encounter for immunization: Secondary | ICD-10-CM

## 2016-06-07 DIAGNOSIS — M1712 Unilateral primary osteoarthritis, left knee: Secondary | ICD-10-CM | POA: Diagnosis not present

## 2016-06-07 DIAGNOSIS — R600 Localized edema: Secondary | ICD-10-CM | POA: Diagnosis not present

## 2016-06-07 LAB — CBC
HEMATOCRIT: 34.4 % — AB (ref 36.0–46.0)
HEMOGLOBIN: 11.4 g/dL — AB (ref 12.0–15.0)
MCHC: 33.1 g/dL (ref 30.0–36.0)
MCV: 86.8 fl (ref 78.0–100.0)
Platelets: 228 10*3/uL (ref 150.0–400.0)
RBC: 3.96 Mil/uL (ref 3.87–5.11)
RDW: 15.6 % — ABNORMAL HIGH (ref 11.5–15.5)
WBC: 4.8 10*3/uL (ref 4.0–10.5)

## 2016-06-07 LAB — COMPREHENSIVE METABOLIC PANEL
ALBUMIN: 3.8 g/dL (ref 3.5–5.2)
ALT: 16 U/L (ref 0–35)
AST: 17 U/L (ref 0–37)
Alkaline Phosphatase: 59 U/L (ref 39–117)
BUN: 11 mg/dL (ref 6–23)
CALCIUM: 9.2 mg/dL (ref 8.4–10.5)
CHLORIDE: 104 meq/L (ref 96–112)
CO2: 27 mEq/L (ref 19–32)
Creatinine, Ser: 0.78 mg/dL (ref 0.40–1.20)
GFR: 104.14 mL/min (ref >60.00)
Glucose, Bld: 82 mg/dL (ref 70–99)
POTASSIUM: 3.5 meq/L (ref 3.5–5.1)
Sodium: 138 mEq/L (ref 135–145)
Total Bilirubin: 0.5 mg/dL (ref 0.2–1.2)
Total Protein: 6.9 g/dL (ref 6.0–8.3)

## 2016-06-07 LAB — TSH: TSH: 1.72 u[IU]/mL (ref 0.35–4.50)

## 2016-06-07 MED ORDER — DICLOFENAC SODIUM 75 MG PO TBEC: 75.0000 mg | DELAYED_RELEASE_TABLET | Freq: Two times a day (BID) | ORAL | 0 refills | Status: DC

## 2016-06-07 NOTE — Patient Instructions (Signed)
Nice to see you. We are going to refer you to sports medicine to consider steroid injections or other treatment for your left knee.  I will check some lab work today and call with the results. You can try the diclofenac for discomfort. Please continue to monitor your blood pressure.

## 2016-06-07 NOTE — Assessment & Plan Note (Signed)
At goal. Continue current medications. Check CMP. 

## 2016-06-07 NOTE — Assessment & Plan Note (Signed)
Continues to be an issue. No CHF symptoms. Is no longer on amlodipine. We'll check lab work. If negative suspect venous insufficiency.

## 2016-06-07 NOTE — Assessment & Plan Note (Signed)
Patient with osteoarthritis of the left knee. Fairly significant on imaging with what appears to be close to full loss of joint space in the medial compartment. Discussed with patient seeing sports medicine for ultrasound-guided injection given loss of joint space. We will start on diclofenac to see if this would be beneficial. She will stop the Aleve.

## 2016-06-07 NOTE — Progress Notes (Signed)
  Donna AlarEric Jekhi Bolin, MD Phone: [phone]  Donna BoeckSandy R Valencia is a 40 y.o. female who presents today for follow-up.  Left knee osteoarthritis: Continues to have pain. Continues to swell some though this is less than previously. Continues to throb. Notes it hurts all the time. Had an x-ray that showed significant degenerative changes particularly in the medial compartment. She did exercises though they do not help. She's been taking Aleve 440 mg 3 times daily with little benefit.  Lower extremity edema: Notes this is still there though is better. Improves with propping her legs up some. She's no longer on amlodipine. She notes no chest pain, shortness of breath, or orthopnea.  HYPERTENSION  Disease Monitoring  Home BP Monitoring typical blood pressure 140/80 Chest pain- no    Dyspnea- no Medications  Compliance-  taking HCTZ.  Edema- see above  PMH: nonsmoker.   ROS see history of present illness  Objective  Physical Exam Vitals:   06/07/16 1317  BP: 128/88  Pulse: 98  Temp: 98.5 F (36.9 C)    BP Readings from Last 3 Encounters:  06/07/16 128/88  04/05/16 128/90  11/27/15 124/86   Wt Readings from Last 3 Encounters:  06/07/16 275 lb 8 oz (125 kg)  04/05/16 269 lb (122 kg)  11/27/15 264 lb 12.8 oz (120.1 kg)    Physical Exam  Constitutional: She is well-developed, well-nourished, and in no distress.  Cardiovascular: Normal rate, regular rhythm and normal heart sounds.   1+ pitting edema bilaterally to mid shin  Pulmonary/Chest: Effort normal and breath sounds normal.  Musculoskeletal:  Left knee with minimal effusion, medial and lateral joint line tenderness, no ligamentous laxity, negative McMurray's, no warmth or erythema, right knee with no effusion, joint line tenderness, ligamentous laxity, warmth, or erythema, negative McMurray's  Neurological: She is alert. Gait normal.  Skin: Skin is warm and dry.     Assessment/Plan: Please see individual problem  list.  Essential hypertension At goal. Continue current medications. Check CMP.  Osteoarthritis of left knee Patient with osteoarthritis of the left knee. Fairly significant on imaging with what appears to be close to full loss of joint space in the medial compartment. Discussed with patient seeing sports medicine for ultrasound-guided injection given loss of joint space. We will start on diclofenac to see if this would be beneficial. She will stop the Aleve.  Bilateral lower extremity edema Continues to be an issue. No CHF symptoms. Is no longer on amlodipine. We'll check lab work. If negative suspect venous insufficiency.   Orders Placed This Encounter  Procedures  . Comprehensive metabolic panel  . CBC  . TSH  . Ambulatory referral to Sports Medicine    Referral Priority:   Routine    Referral Type:   Consultation    Number of Visits Requested:   1    Meds ordered this encounter  Medications  . diclofenac (VOLTAREN) 75 MG EC tablet    Sig: Take 1 tablet (75 mg total) by mouth 2 (two) times daily.    Dispense:  30 tablet    Refill:  0   Donna AlarEric Ivorie Uplinger, MD [facility] [place] [place]

## 2016-06-07 NOTE — Progress Notes (Signed)
Pre visit review using our clinic review tool, if applicable. No additional management support is needed unless otherwise documented below in the visit note. 

## 2016-06-12 ENCOUNTER — Encounter: Payer: Self-pay | Admitting: Family Medicine

## 2016-06-12 ENCOUNTER — Telehealth: Payer: Self-pay | Admitting: *Deleted

## 2016-06-12 NOTE — Telephone Encounter (Signed)
Notified patient of lab results 

## 2016-06-12 NOTE — Telephone Encounter (Signed)
Patient requested lab results  Pt contact [phone]

## 2016-06-22 NOTE — Progress Notes (Signed)
Tawana ScaleZach Cortni Tays D.O. [facility] [address] [place], [place] [address] Phone: [phone] Subjective:    I'm seeing this patient by the request  of:  Marikay AlarEric Sonnenberg, MD   CC: Left knee pain   BJY:[id number]:Subjective  Donna BoeckSandy R Valencia is a 40 y.o. female coming in with complaint of left knee pain. Patient is had this pain for quite some time. Seem to be intermittent but now seems to be much more frequent. Patient did see primary care provider and did have x-rays. X-rays taken 04/05/2016 was independently visualized by me showing significant tricompartmental osteoarthritic changes. Patient was given some exercises and referred here for potential intervention. Patient states She has pain more on the medial aspect of the knee. Some increasing instability noted. Patient  first steps in the morning as well as after sitting for long amount time more discomfort. Has been having difficulty even at work. Rates the severity of pain a 7 out of 10 and worsening.     Past Medical History:  Diagnosis Date  . Headache   . Hypertension   . Migraines    No past surgical history on file. Social History   Social History  . Marital status: Married    Spouse name: N/A  . Number of children: N/A  . Years of education: N/A   Social History Main Topics  . Smoking status: Never Smoker  . Smokeless tobacco: None  . Alcohol use No  . Drug use: No  . Sexual activity: Not Asked   Other Topics Concern  . None   Social History Narrative  . None   No Known Allergies Family History  Problem Relation Age of Onset  . Arthritis    . Breast cancer      Grandparent, another relative  . Hypertension      Parent  . Diabetes      Parent  . Cancer      Both parents    Past medical history, social, surgical and family history all reviewed in electronic medical record.  No pertanent information unless stated regarding to the chief complaint.   Review of Systems:Review of systems updated and as  accurate as of 06/24/16  No headache, visual changes, nausea, vomiting, diarrhea, constipation, dizziness, abdominal pain, skin rash, fevers, chills, night sweats, weight loss, swollen lymph nodes, body aches, joint swelling, muscle aches, chest pain, shortness of breath, mood changes.   Objective  Blood pressure 136/84, pulse 90, height 5\' 6"  (1.676 m), SpO2 98 %. Systems examined below as of 06/24/16   General: No apparent distress alert and oriented x3 mood and affect normal, dressed appropriately.  HEENT: Pupils equal, extraocular movements intact  Respiratory: Patient's speak in full sentences and does not appear short of breath  Cardiovascular: No lower extremity edema, non tender, no erythema  Skin: Warm dry intact with no signs of infection or rash on extremities or on axial skeleton.  Abdomen: Soft nontender  Neuro: Cranial nerves II through XII are intact, neurovascularly intact in all extremities with 2+ DTRs and 2+ pulses.  Lymph: No lymphadenopathy of posterior or anterior cervical chain or axillae bilaterally.  Gait normal with good balance and coordination.  MSK:  Non tender with full range of motion and good stability and symmetric strength and tone of shoulders, elbows, wrist, hip, and ankles bilaterally.  Knee: Normal to inspection with no erythema or effusion or obvious bony abnormalities. Palpation normal with no warmth, joint line tenderness, patellar tenderness, or condyle tenderness. ROM  full in flexion and extension and lower leg rotation. Ligaments with solid consistent endpoints including ACL, PCL, LCL, MCL. Negative Mcmurray's, Apley's, and Thessalonian tests. Non painful patellar compression. Patellar glide without crepitus. Patellar and quadriceps tendons unremarkable. Hamstring and quadriceps strength is normal.   MSK US performed of: Left knee This study was ordered, performed, and interpreted by Terrilee FilesZach Ezelle Surprenant D.O.  Knee: Does have significant narrowing of  the medial joint line near bone-on-bone. Mild spurring noted beneath the patella and quadriceps tendons. Narrowing of the patellofemoral joint noted as well. Impression: Severe arthritic changes of the left knee  After informed written and verbal consent, patient was seated on exam table. Left knee was prepped with alcohol swab and utilizing anterolateral approach, patient's left knee space was injected with 4:1  marcaine 0.5%: Kenalog 40mg /dL. Patient tolerated the procedure well without immediate complications.  Procedure note 97110; 15 minutes spent for Therapeutic exercises as stated in above notes.  This included exercises focusing on stretching, strengthening, with significant focus on eccentric aspects. Patient given exercises for vastus medialis oblique strengthening as well as hip abductor strengthening. Eccentric hamstring exercises given.  Proper technique shown and discussed handout in great detail with ATC.  All questions were discussed and answered.     Impression and Recommendations:     This case required medical decision making of moderate complexity.      Note: This dictation was prepared with Dragon dictation along with smaller phrase technology. Any transcriptional errors that result from this process are unintentional.

## 2016-06-24 ENCOUNTER — Ambulatory Visit: Payer: Self-pay

## 2016-06-24 ENCOUNTER — Encounter: Payer: Self-pay | Admitting: Family Medicine

## 2016-06-24 ENCOUNTER — Ambulatory Visit (INDEPENDENT_AMBULATORY_CARE_PROVIDER_SITE_OTHER): Payer: Self-pay | Admitting: Family Medicine

## 2016-06-24 VITALS — BP 136/84 | HR 90 | Ht 66.0 in

## 2016-06-24 DIAGNOSIS — M25562 Pain in left knee: Secondary | ICD-10-CM

## 2016-06-24 DIAGNOSIS — M1712 Unilateral primary osteoarthritis, left knee: Secondary | ICD-10-CM

## 2016-06-24 MED ORDER — DICLOFENAC SODIUM 2 % TD SOLN: 2.0000 "application " | Freq: Two times a day (BID) | TRANSDERMAL | 3 refills | Status: DC

## 2016-06-24 NOTE — Assessment & Plan Note (Signed)
Patient given injection and tolerated the procedure well. We discussed with patient about a custom brace, patient even topical anti-inflammatories and work with Event organiserathletic trainer to learn home exercises. Discussed proper shoes. Discussed icing regimen. Patient come back and see me again in 4 weeks. If worsening pain patient could be a candidate for viscous supplementation.

## 2016-06-24 NOTE — Patient Instructions (Addendum)
Good to see you.  Ice 20 minutes 2 times daily. Usually after activity and before bed. Exercises 3 times a week.  pennsaid pinkie amount topically 2 times daily as needed.  Vitamin D 2000 IU daily  Turmeric 500mg  daily  Tart cherry extract any dose at night They will call you on the brace Low impact exercises like biking and elliptical would be good.  See me again in 4 weeks

## 2016-07-21 NOTE — Progress Notes (Deleted)
Tawana ScaleZach Smith D.O. [facility] [address] [place], [place] [address] Phone: [phone]([phone] Subjective:    I'm seeing this patient by the request  of:  Marikay AlarEric Sonnenberg, MD   CC: Left knee pain   BJY:[id number]:Subjective  Donna BoeckSandy R Valencia is a 40 y.o. female coming in with complaint of left knee pain. Patient was found to have severe arthritis of the knee. Patient was given a steroid injection and was to do conservative therapy including home exercises and topical anti-inflammatory's. Patient states.   X-rays taken 04/05/2016 were independently visualized by me showing significant tricompartmental degenerative changes of the knee  Past Medical History:  Diagnosis Date  . Headache   . Hypertension   . Migraines    No past surgical history on file. Social History   Social History  . Marital status: Married    Spouse name: N/A  . Number of children: N/A  . Years of education: N/A   Social History Main Topics  . Smoking status: Never Smoker  . Smokeless tobacco: Not on file  . Alcohol use No  . Drug use: No  . Sexual activity: Not on file   Other Topics Concern  . Not on file   Social History Narrative  . No narrative on file   No Known Allergies Family History  Problem Relation Age of Onset  . Arthritis    . Breast cancer      Grandparent, another relative  . Hypertension      Parent  . Diabetes      Parent  . Cancer      Both parents    Past medical history, social, surgical and family history all reviewed in electronic medical record.  No pertanent information unless stated regarding to the chief complaint.   Review of Systems:Review of systems updated and as accurate as of 07/21/16  No headache, visual changes, nausea, vomiting, diarrhea, constipation, dizziness, abdominal pain, skin rash, fevers, chills, night sweats, weight loss, swollen lymph nodes, body aches, joint swelling, muscle aches, chest pain, shortness of breath, mood changes.   Objective  There  were no vitals taken for this visit. Systems examined below as of 07/21/16   General: No apparent distress alert and oriented x3 mood and affect normal, dressed appropriately.  HEENT: Pupils equal, extraocular movements intact  Respiratory: Patient's speak in full sentences and does not appear short of breath  Cardiovascular: No lower extremity edema, non tender, no erythema  Skin: Warm dry intact with no signs of infection or rash on extremities or on axial skeleton.  Abdomen: Soft nontender  Neuro: Cranial nerves II through XII are intact, neurovascularly intact in all extremities with 2+ DTRs and 2+ pulses.  Lymph: No lymphadenopathy of posterior or anterior cervical chain or axillae bilaterally.  Gait normal with good balance and coordination.  MSK:  Non tender with full range of motion and good stability and symmetric strength and tone of shoulders, elbows, wrist, hip, and ankles bilaterally.  Knee: Normal to inspection with no erythema or effusion or obvious bony abnormalities. Palpation normal with no warmth, joint line tenderness, patellar tenderness, or condyle tenderness. ROM full in flexion and extension and lower leg rotation. Ligaments with solid consistent endpoints including ACL, PCL, LCL, MCL. Negative Mcmurray's, Apley's, and Thessalonian tests. Non painful patellar compression. Patellar glide without crepitus. Patellar and quadriceps tendons unremarkable. Hamstring and quadriceps strength is normal.   MSK US performed of: Left knee This study was ordered, performed, and interpreted by Ian MalkinZach  Smith D.O.  Knee: Does have significant narrowing of the medial joint line near bone-on-bone. Mild spurring noted beneath the patella and quadriceps tendons. Narrowing of the patellofemoral joint noted as well. Impression: Severe arthritic changes of the left knee  After informed written and verbal consent, patient was seated on exam table. Left knee was prepped with alcohol swab  and utilizing anterolateral approach, patient's left knee space was injected with 4:1  marcaine 0.5%: Kenalog 40mg /dL. Patient tolerated the procedure well without immediate complications.      Impression and Recommendations:     This case required medical decision making of moderate complexity.      Note: This dictation was prepared with Dragon dictation along with smaller phrase technology. Any transcriptional errors that result from this process are unintentional.

## 2016-07-22 ENCOUNTER — Ambulatory Visit: Payer: BLUE CROSS/BLUE SHIELD | Admitting: Family Medicine

## 2016-09-09 ENCOUNTER — Ambulatory Visit: Payer: BLUE CROSS/BLUE SHIELD | Admitting: Family Medicine

## 2017-01-01 ENCOUNTER — Ambulatory Visit (INDEPENDENT_AMBULATORY_CARE_PROVIDER_SITE_OTHER): Payer: 59 | Admitting: Family Medicine

## 2017-01-01 ENCOUNTER — Encounter: Payer: Self-pay | Admitting: Family Medicine

## 2017-01-01 VITALS — BP 146/106 | HR 89 | Temp 98.9°F | Wt 270.0 lb

## 2017-01-01 DIAGNOSIS — I10 Essential (primary) hypertension: Secondary | ICD-10-CM

## 2017-01-01 DIAGNOSIS — R7303 Prediabetes: Secondary | ICD-10-CM

## 2017-01-01 DIAGNOSIS — Z8742 Personal history of other diseases of the female genital tract: Secondary | ICD-10-CM

## 2017-01-01 DIAGNOSIS — Z87898 Personal history of other specified conditions: Secondary | ICD-10-CM

## 2017-01-01 DIAGNOSIS — D649 Anemia, unspecified: Secondary | ICD-10-CM

## 2017-01-01 LAB — COMPREHENSIVE METABOLIC PANEL
ALBUMIN: 3.8 g/dL (ref 3.5–5.2)
ALT: 15 U/L (ref 0–35)
AST: 15 U/L (ref 0–37)
Alkaline Phosphatase: 54 U/L (ref 39–117)
BUN: 14 mg/dL (ref 6–23)
CO2: 28 mEq/L (ref 19–32)
Calcium: 9.4 mg/dL (ref 8.4–10.5)
Chloride: 106 mEq/L (ref 96–112)
Creatinine, Ser: 0.85 mg/dL (ref 0.40–1.20)
GFR: 94.05 mL/min (ref >60.00)
GLUCOSE: 99 mg/dL (ref 70–99)
Potassium: 3.4 mEq/L — ABNORMAL LOW (ref 3.5–5.1)
SODIUM: 140 meq/L (ref 135–145)
TOTAL PROTEIN: 6.9 g/dL (ref 6.0–8.3)
Total Bilirubin: 0.2 mg/dL (ref 0.2–1.2)

## 2017-01-01 LAB — CBC
HEMATOCRIT: 35.8 % — AB (ref 36.0–46.0)
HEMOGLOBIN: 11.8 g/dL — AB (ref 12.0–15.0)
MCHC: 32.8 g/dL (ref 30.0–36.0)
MCV: 88.5 fl (ref 78.0–100.0)
Platelets: 259 10*3/uL (ref 150.0–400.0)
RBC: 4.05 Mil/uL (ref 3.87–5.11)
RDW: 15.2 % (ref 11.5–15.5)
WBC: 5.1 10*3/uL (ref 4.0–10.5)

## 2017-01-01 LAB — HEMOGLOBIN A1C: HEMOGLOBIN A1C: 6.5 % (ref 4.6–6.5)

## 2017-01-01 MED ORDER — HYDROCHLOROTHIAZIDE 25 MG PO TABS: 25.0000 mg | ORAL_TABLET | Freq: Every day | ORAL | 1 refills | Status: AC

## 2017-01-01 NOTE — Patient Instructions (Signed)
Nice to see you. Your blood pressure is uncontrolled. We are going to increase your hydrochlorothiazide. We'll check lab work today. We have referred you to gynecology for a Pap smear. If you do not hear from them regarding an appointment in the next week or so please contact us.

## 2017-01-01 NOTE — Assessment & Plan Note (Signed)
Has been mildly anemic previously. Normal heart rate. She did not complete the stool cards as previously advised. We will give these to her today. We'll recheck a CBC.

## 2017-01-01 NOTE — Assessment & Plan Note (Signed)
Has had some increased polyuria and polydipsia. We will check an A1c. Encouraged continued dietary changes. Discussed exercise at length.

## 2017-01-01 NOTE — Assessment & Plan Note (Signed)
Never followed up with gynecology. We will place a referral to get her set up with them for follow-up.

## 2017-01-01 NOTE — Assessment & Plan Note (Signed)
None at goal today. We'll increase her hydrochlorothiazide to 25 mg daily. We'll check a CMP today. Follow-up in one month.

## 2017-01-01 NOTE — Progress Notes (Signed)
  Tommi Rumps, MD Phone: [phone]  Donna Valencia is a 41 y.o. female who presents today for f/u.  HYPERTENSION  Disease Monitoring  Home BP Monitoring 150/80 Chest pain- no    Dyspnea- no Medications  Compliance-  Taking HCTZ 12.5 mg.  Edema- no  Diabetes: Patient does note some increased polyuria and polydipsia over the last 3 weeks. Notes no dysuria. She has worked on cutting back on sodas and eating less sugar. She's not exercising at all.  She has a history of an abnormal Pap smear in the past. Previously seen [facility]. She was previously advised to see gynecology for follow-up on this though has not done this yet.  Anemia: Patient never did the stool cards. Was minimally low previously. She does not report any rectal bleeding. She notes her menstrual cycles are heavy on days 1 and 2 though lighter on days 3 through 5. Has menstrual cycle once monthly.  PMH: nonsmoker.   ROS see history of present illness  Objective  Physical Exam Vitals:   01/01/17 1051  BP: (!) 146/106  Pulse: 89  Temp: 98.9 F (37.2 C)    BP Readings from Last 3 Encounters:  01/01/17 (!) 146/106  06/24/16 136/84  06/07/16 128/88   Wt Readings from Last 3 Encounters:  01/01/17 270 lb (122.5 kg)  06/07/16 275 lb 8 oz (125 kg)  04/05/16 269 lb (122 kg)    Physical Exam  Constitutional: No distress.  Cardiovascular: Normal rate, regular rhythm and normal heart sounds.   Pulmonary/Chest: Effort normal and breath sounds normal.  Musculoskeletal: She exhibits no edema.  Neurological: She is alert. Gait normal.  Skin: Skin is warm and dry. She is not diaphoretic.     Assessment/Plan: Please see individual problem list.  Essential hypertension None at goal today. We'll increase her hydrochlorothiazide to 25 mg daily. We'll check a CMP today. Follow-up in one month.  History of abnormal cervical Pap smear Never followed up with gynecology. We will place a referral to get her set up  with them for follow-up.  Prediabetes Has had some increased polyuria and polydipsia. We will check an A1c. Encouraged continued dietary changes. Discussed exercise at length.  Anemia Has been mildly anemic previously. Normal heart rate. She did not complete the stool cards as previously advised. We will give these to her today. We'll recheck a CBC.   Orders Placed This Encounter  Procedures  . Fecal occult blood, imunochemical    Standing Status:   Future    Standing Expiration Date:   01/01/2018  . HgB A1c  . Comp Met (CMET)  . CBC  . Ambulatory referral to Gynecology    Referral Priority:   Routine    Referral Type:   Consultation    Referral Reason:   Specialty Services Required    Requested Specialty:   Gynecology    Number of Visits Requested:   1    Meds ordered this encounter  Medications  . hydrochlorothiazide (HYDRODIURIL) 25 MG tablet    Sig: Take 1 tablet (25 mg total) by mouth daily.    Dispense:  90 tablet    Refill:  1   Tommi Rumps, MD [place]

## 2017-01-06 ENCOUNTER — Other Ambulatory Visit: Payer: Self-pay | Admitting: Family Medicine

## 2017-01-06 DIAGNOSIS — E876 Hypokalemia: Secondary | ICD-10-CM

## 2017-01-09 ENCOUNTER — Other Ambulatory Visit: Payer: 59

## 2017-01-22 ENCOUNTER — Ambulatory Visit: Payer: Self-pay | Admitting: Obstetrics & Gynecology

## 2017-02-05 ENCOUNTER — Ambulatory Visit: Payer: 59 | Admitting: Family Medicine

## 2017-02-05 DIAGNOSIS — Z0289 Encounter for other administrative examinations: Secondary | ICD-10-CM

## 2017-02-20 ENCOUNTER — Encounter: Payer: Self-pay | Admitting: Obstetrics & Gynecology

## 2017-02-24 ENCOUNTER — Ambulatory Visit: Payer: Self-pay | Admitting: Obstetrics & Gynecology

## 2017-03-05 ENCOUNTER — Telehealth: Payer: Self-pay | Admitting: Obstetrics & Gynecology

## 2017-03-05 NOTE — Telephone Encounter (Signed)
[facility]. Unable to leave Voicemail due to voicemail box not set up.

## 2017-03-07 NOTE — Telephone Encounter (Signed)
Unable to leave Voicemail due to voicemail box not set up °

## 2017-03-14 ENCOUNTER — Ambulatory Visit (INDEPENDENT_AMBULATORY_CARE_PROVIDER_SITE_OTHER): Payer: 59 | Admitting: Podiatry

## 2017-03-14 ENCOUNTER — Ambulatory Visit (INDEPENDENT_AMBULATORY_CARE_PROVIDER_SITE_OTHER): Payer: 59

## 2017-03-14 VITALS — BP 167/111 | HR 89 | Temp 98.2°F | Resp 16

## 2017-03-14 DIAGNOSIS — M722 Plantar fascial fibromatosis: Secondary | ICD-10-CM

## 2017-03-14 DIAGNOSIS — M79672 Pain in left foot: Secondary | ICD-10-CM | POA: Diagnosis not present

## 2017-03-14 DIAGNOSIS — M79671 Pain in right foot: Secondary | ICD-10-CM

## 2017-03-14 MED ORDER — DICLOFENAC SODIUM 75 MG PO TBEC: 75.0000 mg | DELAYED_RELEASE_TABLET | Freq: Two times a day (BID) | ORAL | 1 refills | Status: AC

## 2017-03-14 NOTE — Telephone Encounter (Signed)
Unable to leave Voicemail due to voicemail box not set up

## 2017-03-14 NOTE — Progress Notes (Signed)
Subjective:     Patient ID: Donna BoeckSandy R Valencia, female   DOB: 11/16/1975, 41 y.o.   MRN: [id number]  HPI   Review of Systems  All other systems reviewed and are negative.      Objective:   Physical Exam     Assessment:         Plan:

## 2017-03-16 MED ORDER — BETAMETHASONE SOD PHOS & ACET 6 (3-3) MG/ML IJ SUSP: 3.0000 mg | Freq: Once | INTRAMUSCULAR | Status: AC

## 2017-03-16 NOTE — Progress Notes (Signed)
Patient ID: Donna Valencia, female   DOB: 01/02/1976, 41 y.o.   MRN: [id number]   Subjective: 41 year old female presents to the office today for evaluation of bilateral heel pain with the right worse than left. Patient states the pain is getting worse and this is been going on for approximately 1 month now. Patient states that walking and long periods of standing aggravate the pain. Patient's been taking ibuprofen and purchased a pair of new shoes over nothing helps. Patient denies trauma. Patient experiences burning and tingling sensations as well. Patient presents today for further treatment and evaluation  Objective: Physical Exam General: The patient is alert and oriented x3 in no acute distress.  Dermatology: Skin is warm, dry and supple bilateral lower extremities. Negative for open lesions or macerations bilateral.   Vascular: Dorsalis Pedis and Posterior Tibial pulses palpable bilateral.  Capillary fill time is immediate to all digits.  Neurological: Epicritic and protective threshold intact bilateral.   Musculoskeletal: Tenderness to palpation at the medial calcaneal tubercale and through the insertion of the plantar fascia of the bilateral feet. All other joints range of motion within normal limits bilateral. Strength 5/5 in all groups bilateral.   Radiographic exam: Normal osseous mineralization. Joint spaces preserved. No fracture/dislocation/boney destruction. Calcaneal spur present with mild thickening of plantar fascia bilateral. No other soft tissue abnormalities or radiopaque foreign bodies.   Assessment: 1. plantar fasciitis bilateral feet-right worse than left  Plan of Care:  1. Patient evaluated. Xrays reviewed.   2. Injection of 0.5cc Celestone soluspan injected into the bilateral heels.  3.Rx for Diclofenac 75mg  PO BID ordered for patient. 4. Plantar fascial band(s) dispensed for bilateral plantar fasciitis. 5. Instructed patient regarding therapies and modalities at  home to alleviate symptoms.  6. Return to clinic in 4 weeks.    Felecia ShellingBrent M. Evans, DPM [facility]  Dr. Felecia ShellingBrent M. Evans, DPM    [address]                                   [place], [place] [address]                Office [phone]  Fax [phone]) [phone]

## 2017-03-20 ENCOUNTER — Telehealth: Payer: Self-pay | Admitting: Family Medicine

## 2017-03-20 NOTE — Telephone Encounter (Signed)
Donna Valencia from [facility] called and stated that they have been unable to get in touch with patient, they have left several messages.

## 2017-04-18 ENCOUNTER — Ambulatory Visit (INDEPENDENT_AMBULATORY_CARE_PROVIDER_SITE_OTHER): Payer: 59 | Admitting: Podiatry

## 2017-04-18 DIAGNOSIS — M722 Plantar fascial fibromatosis: Secondary | ICD-10-CM

## 2017-04-18 MED ORDER — METHYLPREDNISOLONE 4 MG PO TBPK: ORAL_TABLET | ORAL | 0 refills | Status: DC

## 2017-04-18 MED ORDER — NONFORMULARY OR COMPOUNDED ITEM: 2 refills | Status: AC

## 2017-04-21 MED ORDER — BETAMETHASONE SOD PHOS & ACET 6 (3-3) MG/ML IJ SUSP: 3.0000 mg | Freq: Once | INTRAMUSCULAR | Status: AC

## 2017-04-21 NOTE — Progress Notes (Signed)
   HPI: 41 year old female presents to the office today for follow-up evaluation of plantar fasciitis to the bilateral feet right worse than the left. Patient states she is doing much better she still has right foot pain. Her left foot is much improved. She believes the injections helped for a couple of days. The plantar fascial brace is not helping. She presents for further treatment and evaluation   Physical Exam: General: The patient is alert and oriented x3 in no acute distress.  Dermatology: Skin is warm, dry and supple bilateral lower extremities. Negative for open lesions or macerations.  Vascular: Palpable pedal pulses bilaterally. No edema or erythema noted. Capillary refill within normal limits.  Neurological: Epicritic and protective threshold grossly intact bilaterally.   Musculoskeletal Exam: Pain on palpation noted to the medial calcaneal tubercle insertion of the plantar fascia right foot. Range of motion within normal limits to all pedal and ankle joints bilateral. Muscle strength 5/5 in all groups bilateral.   Assessment: 1. Plantar fasciitis bilateral feet-right worse than left 2. Left plantar fasciitis is not symptomatic today   Plan of Care:  1. Patient was evaluated. 2. Injection of 0.5 mL Celestone Soluspan injected in the right plantar fascia 3. Today a night splint was dispensed for the right lower extremity 4. Prescription for Medrol Dosepak 5. Continue diclofenac 75 mg after completing the Medrol Dosepak. 6. Discontinue the plantar fascial brace, since it seems to be aggravating the symptoms 7. Prescription for anti-plantar pain cream [facility] 8. Return to clinic in 4 weeks   Felecia ShellingBrent M. Evans, DPM [facility]  Dr. Felecia ShellingBrent M. Evans, DPM    [address]                                        [place], [place] [address]                Office [phone]  Fax [phone]

## 2017-05-16 ENCOUNTER — Ambulatory Visit: Payer: 59 | Admitting: Podiatry

## 2017-05-27 ENCOUNTER — Ambulatory Visit (INDEPENDENT_AMBULATORY_CARE_PROVIDER_SITE_OTHER): Payer: 59 | Admitting: Podiatry

## 2017-05-27 DIAGNOSIS — M722 Plantar fascial fibromatosis: Secondary | ICD-10-CM

## 2017-05-27 DIAGNOSIS — M659 Synovitis and tenosynovitis, unspecified: Secondary | ICD-10-CM

## 2017-06-02 NOTE — Progress Notes (Signed)
   Subjective: Patient presents today for follow up evaluation of plantar fasciitis of the bilateral feet. She states the injection and Prednisone helped alleviate the pain for about two weeks. She now reports the pain is worse in the right foot and the left foot is doing well. There are no modifying factors noted. Patient presents today for further treatment and evaluation.   Past Medical History:  Diagnosis Date  . Abnormal Pap smear of cervix 10/04/2013   Phreesia  . Diabetes (HCC)   . Headache   . Hypertension   . Migraines      Objective: Physical Exam General: The patient is alert and oriented x3 in no acute distress.  Dermatology: Skin is warm, dry and supple bilateral lower extremities. Negative for open lesions or macerations bilateral.   Vascular: Dorsalis Pedis and Posterior Tibial pulses palpable bilateral.  Capillary fill time is immediate to all digits.  Neurological: Epicritic and protective threshold intact bilateral.   Musculoskeletal: Tenderness to palpation at the medial calcaneal tubercale and through the insertion of the plantar fascia of the right foot. Pain on palpation to the anterior lateral medial aspects of the patient's right ankle. Mild edema noted. All other joints range of motion within normal limits bilateral. Strength 5/5 in all groups bilateral.   Assessment: 1. Plantar fasciitis right 2. Pain in right foot 3. Right ankle synovitis   Plan of Care:  1. Patient evaluated.   2. Injection of 0.5cc Celestone soluspan injected into the right ankle. 3. Pt finished Medrol Dose Pak prescribed at last visit. 4. Continue taking Diclofenac. 5. Appt with [facility] for custom molded orthotics.  6. Return to clinic in 8 weeks.    Felecia Shelling, DPM [facility]  Dr. Felecia Shelling, DPM    [address] [place], [place] [address]                Office [phone]  Fax [phone]

## 2017-06-10 MED ORDER — BETAMETHASONE SOD PHOS & ACET 6 (3-3) MG/ML IJ SUSP: 3.0000 mg | Freq: Once | INTRAMUSCULAR | Status: AC

## 2017-06-18 ENCOUNTER — Ambulatory Visit: Payer: 59 | Admitting: Orthotics

## 2017-06-18 DIAGNOSIS — M722 Plantar fascial fibromatosis: Secondary | ICD-10-CM

## 2017-06-18 DIAGNOSIS — M659 Synovitis and tenosynovitis, unspecified: Secondary | ICD-10-CM

## 2017-06-18 NOTE — Progress Notes (Signed)
Patient came into today for casting bilateral f/o to address plantar fasciitis.  Patient reports history of foot pain involving plantar aponeurosis.  Goal is to provide longitudinal arch support and correct any RF instability due to heel eversion/inversion.  Ultimate goal is to relieve tension at pf insertion calcaneal tuberosity.  Plan on semi-rigid device addressing heel stability and relieving PF tension.     Insurance to be verified coverage...may elect to pay $300 self pay based upon coverage.  No charges put in yet.

## 2017-07-16 ENCOUNTER — Other Ambulatory Visit: Payer: 59 | Admitting: Orthotics

## 2017-07-22 ENCOUNTER — Ambulatory Visit (INDEPENDENT_AMBULATORY_CARE_PROVIDER_SITE_OTHER): Payer: 59 | Admitting: Orthotics

## 2017-07-22 DIAGNOSIS — M722 Plantar fascial fibromatosis: Secondary | ICD-10-CM | POA: Diagnosis not present

## 2017-07-22 DIAGNOSIS — M659 Synovitis and tenosynovitis, unspecified: Secondary | ICD-10-CM

## 2017-07-22 DIAGNOSIS — R600 Localized edema: Secondary | ICD-10-CM

## 2017-07-22 NOTE — Progress Notes (Signed)
Patient came in today to pick up custom made foot orthotics.  The goals were accomplished and the patient reported no dissatisfaction with said orthotics.  Patient was advised of breakin period and how to report any issues.  Patient advised that Christella Scheuermann will cover f/o with proper dx codes; therefore we have to file to insurance.   Patietn advised 398 charge if ded not met; she understood.

## 2017-08-23 IMAGING — DX DG KNEE AP/LAT W/ SUNRISE*L*
3 series · 3 of 3 positions shown · non-contrast
Comparison: None.

CLINICAL DATA: Left knee pain for 1 month, no known injury, initial
encounter

EXAM:
LEFT KNEE 3 VIEWS

[knee ap]
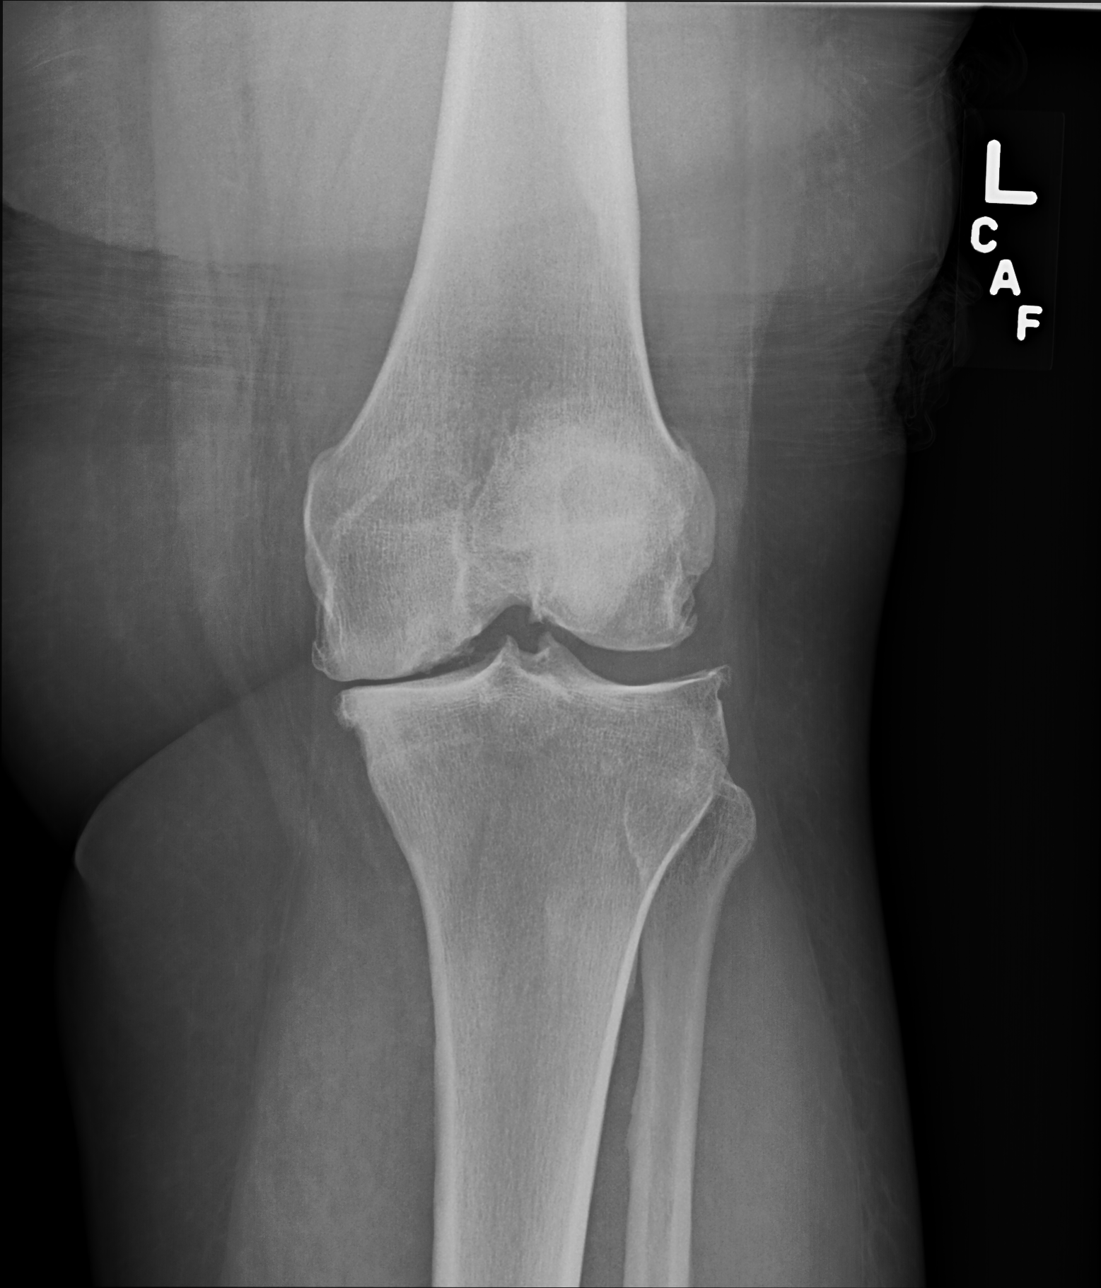

[knee lat]
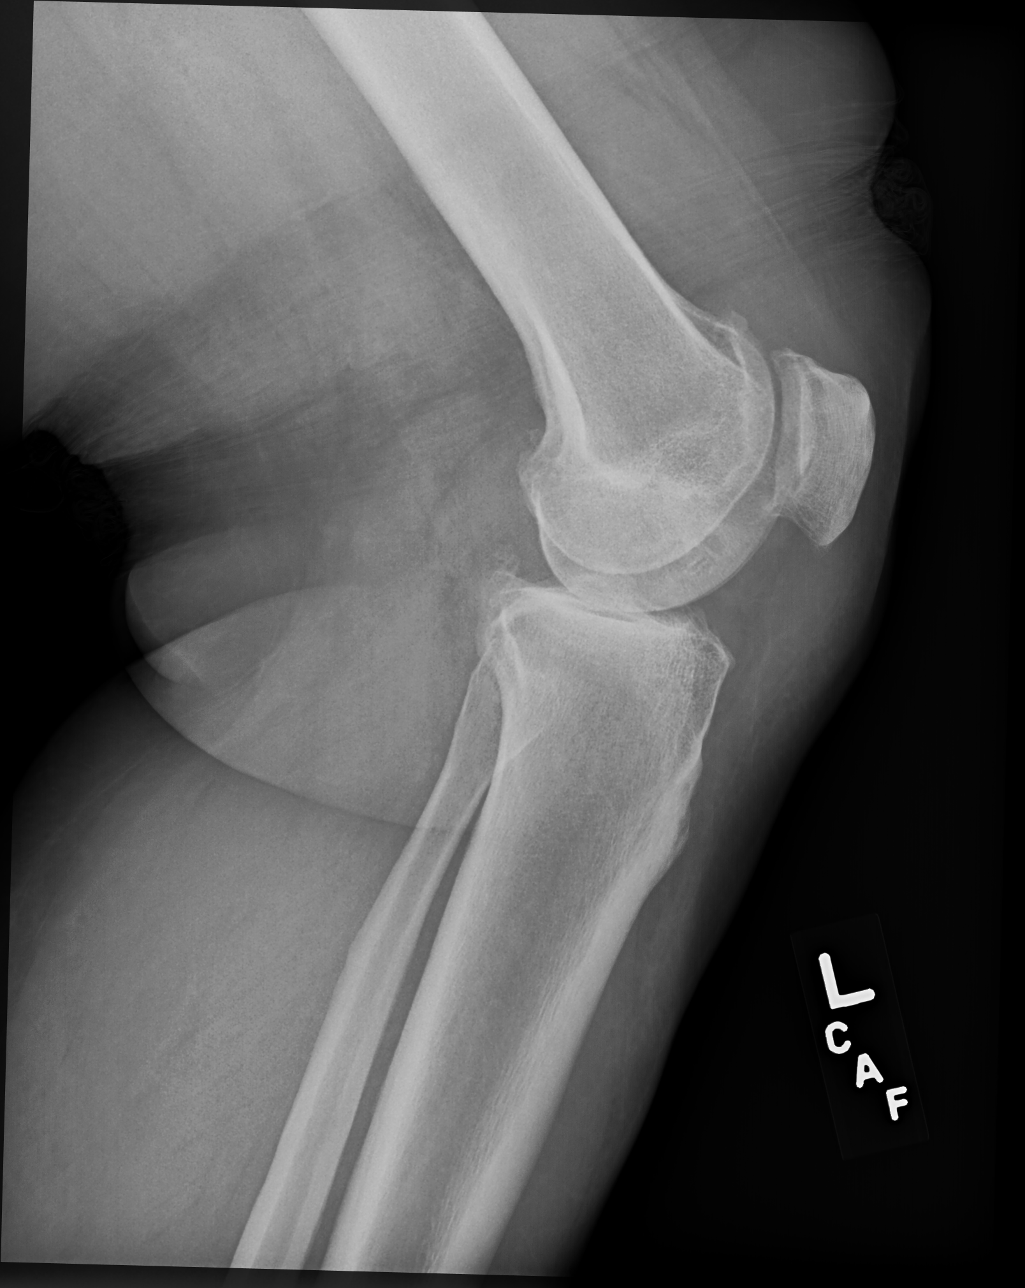

[knee [person_name] view pa]
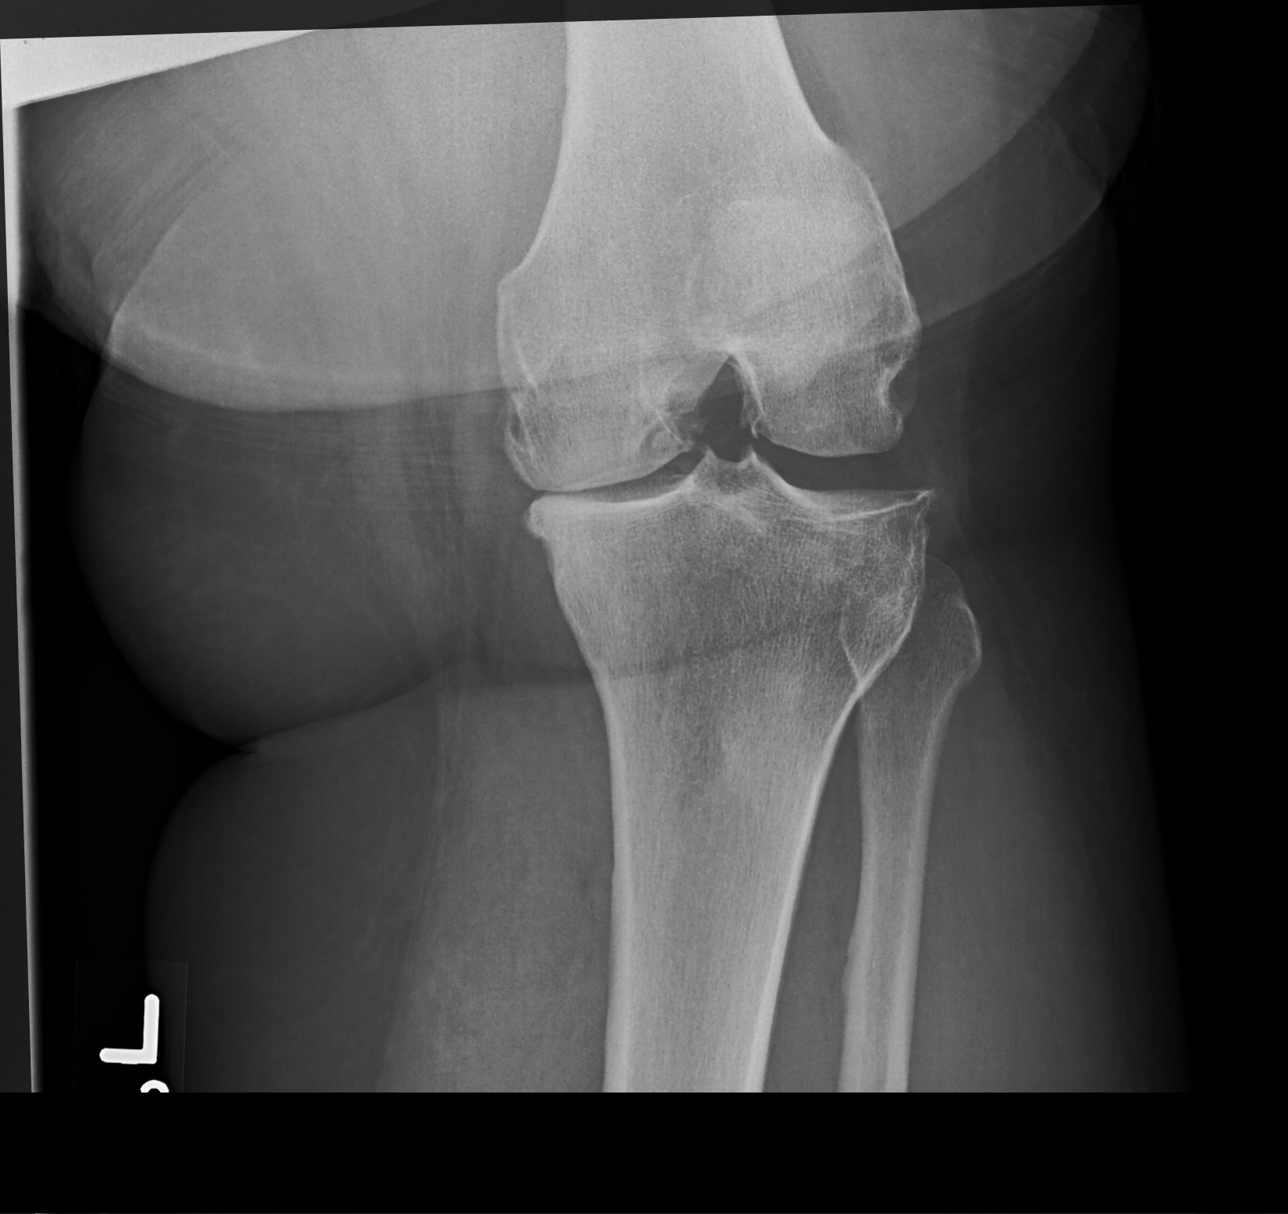

[3 of 3 positions shown; findings below may reference images not displayed]

FINDINGS: No acute fracture or dislocation is noted. Tricompartmental
degenerative changes are seen worst in the medial joint compartment.
No joint effusion or other soft tissue abnormality is seen.
IMPRESSION: Significant joint degenerative change as described.

## 2018-01-07 ENCOUNTER — Telehealth: Payer: Self-pay

## 2018-01-07 NOTE — Telephone Encounter (Signed)
I have scheduled patient for follow up with you

## 2018-01-07 NOTE — Telephone Encounter (Signed)
Noted  

## 2018-01-07 NOTE — Telephone Encounter (Signed)
-----   Message from Glori Luis, MD sent at 01/06/2018  5:21 PM EDT ----- Regarding: Call patient Could you please contact and let her know I got a reminder that she was supposed to have some labs done for follow-up last year?  It does not appear that these were completed.  She has not been seen since that time.  If she is still coming to this office I would suggest we get her set up for follow-up to determine if she still needs those labs.  Thanks.  Minerva Areola. ----- Message ----- From: SYSTEM Sent: 01/06/2018  12:06 AM To: Glori Luis, MD

## 2018-02-20 ENCOUNTER — Encounter: Payer: Self-pay | Admitting: Family Medicine

## 2018-02-20 ENCOUNTER — Ambulatory Visit: Payer: 59 | Admitting: Family Medicine

## 2020-08-30 ENCOUNTER — Ambulatory Visit: Payer: Self-pay | Attending: Oncology

## 2020-08-30 ENCOUNTER — Other Ambulatory Visit: Payer: Self-pay

## 2020-08-30 ENCOUNTER — Ambulatory Visit
Admission: RE | Admit: 2020-08-30 | Discharge: 2020-08-30 | Disposition: A | Discharge status: Home / Self Care | Payer: Self-pay | Source: Ambulatory Visit | Attending: Oncology | Admitting: Oncology

## 2020-08-30 VITALS — BP 164/132 | HR 106 | Temp 98.1°F | Ht 68.17 in | Wt 280.3 lb

## 2020-08-30 DIAGNOSIS — N63 Unspecified lump in unspecified breast: Secondary | ICD-10-CM

## 2020-08-30 DIAGNOSIS — Z0000 Encounter for general adult medical examination without abnormal findings: Secondary | ICD-10-CM

## 2020-08-30 NOTE — Progress Notes (Signed)
  Subjective:     Patient ID: Donna Valencia, female   DOB: 1975-10-11, 45 y.o.   MRN: [id number]  HPI   Review of Systems     Objective:   Physical Exam Chest:  Breasts:     Right: Mass present. No swelling, bleeding, inverted nipple, nipple discharge, skin change or tenderness.     Left: No swelling, bleeding, inverted nipple, mass, nipple discharge, skin change or tenderness.        Comments: Soft mobile mass 9-10 o'clock right breast; non-tender       Assessment:     45 year old patient presents for [facility] clinic visit. Complaint of right breast mass x 1 month.  Patient screened, and meets [facility] eligibility.  Patient does not have insurance, Medicare or Medicaid.  Instructed patient on breast self awareness using teach back method. Clinical breast exam reveals soft mobile mass upper outer right breast.   Denies tenderness on palpation. Risk Assessment    Risk Scores      08/30/2020   Last edited by: Scarlett Presto, RN   5-year risk: 0.6 %   Lifetime risk: 6.9 %            Plan:     Sent for bilateral diagnostic mammogram and ultrasound.  Discussed concerns over elevated blood pressure.  States she stopped medication because it had been within normal limits.  States she will discuss with primary provider.

## 2020-09-01 ENCOUNTER — Other Ambulatory Visit: Payer: Self-pay

## 2020-09-01 ENCOUNTER — Other Ambulatory Visit: Payer: Self-pay | Admitting: *Deleted

## 2020-09-01 LAB — IGP, APTIMA HPV: HPV Aptima: POSITIVE — AB

## 2020-09-01 MED ORDER — METRONIDAZOLE 500 MG PO TABS: 2000.0000 mg | ORAL_TABLET | Freq: Once | ORAL | 0 refills | Status: AC

## 2020-09-01 NOTE — Progress Notes (Signed)
Notified patient of [facility] 2 mammogram results, and negative/HPV positive pap results.  Requesting previous pap results from [facility] to determine management.  Metronidazole 500mg  4 tablets times one dose called in to patient's pharmacy.

## 2020-09-15 ENCOUNTER — Encounter: Payer: Self-pay | Admitting: Obstetrics & Gynecology
